# Patient Record
Sex: Female | Born: 1995 | Race: Black or African American | Hispanic: No | Marital: Single | Smoking: Never smoker
Health system: Southern US, Community
[De-identification: ages and names within clinical notes are randomized; demographics above are authoritative.]

## PROBLEM LIST (undated history)

## (undated) ENCOUNTER — Inpatient Hospital Stay: Payer: Self-pay

## (undated) DIAGNOSIS — O24419 Gestational diabetes mellitus in pregnancy, unspecified control: Secondary | ICD-10-CM

## (undated) DIAGNOSIS — T7840XA Allergy, unspecified, initial encounter: Secondary | ICD-10-CM

## (undated) HISTORY — DX: Allergy, unspecified, initial encounter: T78.40XA

## (undated) HISTORY — PX: MOUTH SURGERY: SHX715

## (undated) HISTORY — DX: Gestational diabetes mellitus in pregnancy, unspecified control: O24.419

---
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
## 2017-08-22 NOTE — L&D Delivery Note (Signed)
Delivery Note At 12:32 AM a viable female infant was delivered via Vaginal, Spontaneous (Presentation: OA).  APGAR: 8, 9; weight  pending.   Placenta status: delivered spontaneously, intact.  Cord:  3VC with the following complications: none.  Cord pH: n/a  Anesthesia:  epidural Episiotomy: None Lacerations: 2nd degree;Vaginal Suture Repair: 3.0 vicryl Est. Blood Loss (mL): 350  Mom to postpartum.  Baby to Couplet care / Skin to Skin.  Called to see patient.  Mom pushed to deliver a viable female infant.  The head followed by shoulders, which delivered without difficulty, and the rest of the body.  No nuchal cord noted.  Baby to mom's chest.  Cord clamped and cut after > 1 min delay.  Cord blood obtained.  Placenta delivered spontaneously, intact, with a 3-vessel cord.  Second degree vaginal laceration repaired with 3-0 Vicryl in standard fashion.  All counts correct.  Hemostasis obtained with IV pitocin and fundal massage. EBL 350 mL.     Thomasene MohairStephen Tristen Luce, MD 08/19/2018, 12:58 AM

## 2018-01-18 ENCOUNTER — Ambulatory Visit (INDEPENDENT_AMBULATORY_CARE_PROVIDER_SITE_OTHER): Payer: Medicaid Other | Admitting: Advanced Practice Midwife

## 2018-01-18 ENCOUNTER — Encounter: Payer: Self-pay | Admitting: Advanced Practice Midwife

## 2018-01-18 VITALS — BP 118/66 | Wt 135.0 lb

## 2018-01-18 DIAGNOSIS — Z1379 Encounter for other screening for genetic and chromosomal anomalies: Secondary | ICD-10-CM

## 2018-01-18 DIAGNOSIS — Z113 Encounter for screening for infections with a predominantly sexual mode of transmission: Secondary | ICD-10-CM

## 2018-01-18 DIAGNOSIS — Z3400 Encounter for supervision of normal first pregnancy, unspecified trimester: Secondary | ICD-10-CM | POA: Insufficient documentation

## 2018-01-18 NOTE — Progress Notes (Signed)
New Obstetric Patient H&P    Chief Complaint: "Desires prenatal care"   History of Present Illness: Patient is a 22 y.o. G1P0000 Not Hispanic or Latino female, presents with amenorrhea and positive home pregnancy test. Patient's last menstrual period was 11/24/2017 (exact date). and based on her  LMP, her EDD is Estimated Date of Delivery: 08/31/18 and her EGA is [redacted]w[redacted]d. Cycles are 5. days, regular, and occur approximately every : 28 days. Her last pap smear was 1 years ago and was no abnormalities.    She had a urine pregnancy test which was positive 1 week(s)  ago. Her last menstrual period was normal and lasted for  5 day(s). Since her LMP she claims she has experienced breast tenderness, fatigue, nausea, vomiting. She denies vaginal bleeding. Her past medical history is noncontributory. This is her first pregnancy.  Since her LMP, she admits to the use of tobacco products  no She claims she has gained   5 pounds since the start of her pregnancy.  There are cats in the home in the home  no  She admits close contact with children on a regular basis  yes  She has had chicken pox in the past yes She has had Tuberculosis exposures, symptoms, or previously tested positive for TB   no Current or past history of domestic violence. no  Genetic Screening/Teratology Counseling: (Includes patient, baby's father, or anyone in either family with:)   1. Patient's age >/= 57 [facility]  no 2. Thalassemia ([place], [place], Mediterranean, or Asian background): MCV<80  no 3. Neural tube defect (meningomyelocele, spina bifida, anencephaly)  no 4. Congenital heart defect  no  5. Down syndrome  no 6. Tay-Sachs (Jewish, [place]  no 7. Canavan's Disease  no 8. Sickle cell disease or trait (African)  no  9. Hemophilia or other blood disorders  no  10. Muscular dystrophy  no  11. Cystic fibrosis  no  12. Huntington's Chorea  no  13. Mental retardation/autism  no 14. Other inherited genetic or  chromosomal disorder  no 15. Maternal metabolic disorder (DM, PKU, etc)  no 16. Patient or FOB with a child with a birth defect not listed above no  16a. Patient or FOB with a birth defect themselves no 17. Recurrent pregnancy loss, or stillbirth  no  18. Any medications since LMP other than prenatal vitamins (include vitamins, supplements, OTC meds, drugs, alcohol)  no 19. Any other genetic/environmental exposure to discuss  no  Infection History:   1. Lives with someone with TB or TB exposed  no  2. Patient or partner has history of genital herpes  no 3. Rash or viral illness since LMP  no 4. History of STI (GC, CT, HPV, syphilis, HIV)  no 5. History of recent travel :  no  Other pertinent information:  no     Review of Systems:10 point review of systems negative unless otherwise noted in HPI  Past Medical History:  History reviewed. No pertinent past medical history.  Past Surgical History:  History reviewed. No pertinent surgical history.  Gynecologic History: Patient's last menstrual period was 11/24/2017 (exact date).  Obstetric History: G1P0000  Family History:  History reviewed. No pertinent family history.  Social History:  Social History   Socioeconomic History  . Marital status: Single    Spouse name: Not on file  . Number of children: Not on file  . Years of education: Not on file  . Highest education level: Not on file  Occupational History  . Not on file  Social Needs  . Financial resource strain: Not on file  . Food insecurity:    Worry: Not on file    Inability: Not on file  . Transportation needs:    Medical: Not on file    Non-medical: Not on file  Tobacco Use  . Smoking status: Never Smoker  . Smokeless tobacco: Never Used  Substance and Sexual Activity  . Alcohol use: Never    Frequency: Never  . Drug use: Never  . Sexual activity: Yes    Birth control/protection: None  Lifestyle  . Physical activity:    Days per week: Not on file      Minutes per session: Not on file  . Stress: Not on file  Relationships  . Social connections:    Talks on phone: Not on file    Gets together: Not on file    Attends religious service: Not on file    Active member of club or organization: Not on file    Attends meetings of clubs or organizations: Not on file    Relationship status: Not on file  . Intimate partner violence:    Fear of current or ex partner: Not on file    Emotionally abused: Not on file    Physically abused: Not on file    Forced sexual activity: Not on file  Other Topics Concern  . Not on file  Social History Narrative  . Not on file    Allergies:  No Known Allergies  Medications: Prior to Admission medications   Not on File    Physical Exam Vitals: Blood pressure 118/66, weight 135 lb (61.2 kg), last menstrual period 11/24/2017.  General: NAD HEENT: normocephalic, anicteric Thyroid: no enlargement, no palpable nodules Pulmonary: No increased work of breathing, CTAB Cardiovascular: RRR, distal pulses 2+ Abdomen: NABS, soft, non-tender, non-distended.  Umbilicus without lesions.  No hepatomegaly, splenomegaly or masses palpable. No evidence of hernia  Genitourinary:  External: Normal external female genitalia.  Normal urethral meatus, normal  Bartholin's and Skene's glands.    Vagina: Normal vaginal mucosa, no evidence of prolapse.    Cervix: Grossly normal in appearance, no bleeding, no CMT  Uterus: Enlarged, mobile, normal contour.   Adnexa: ovaries non-enlarged, no adnexal masses  Rectal: deferred Extremities: no edema, erythema, or tenderness Neurologic: Grossly intact Psychiatric: mood appropriate, affect full   Assessment: 22 y.o. G1P0000 at [redacted]w[redacted]d presenting to initiate prenatal care  Plan: 1) Avoid alcoholic beverages. 2) Patient encouraged not to smoke.  3) Discontinue the use of all non-medicinal drugs and chemicals.  4) Take prenatal vitamins daily.  5) Nutrition, food safety  (fish, cheese advisories, and high nitrite foods) and exercise discussed. 6) Hospital and practice style discussed with cross coverage system.  7) Genetic Screening, such as with 1st Trimester Screening, cell free fetal DNA, AFP testing, and Ultrasound, as well as with amniocentesis and CVS as appropriate, is discussed with patient. At the conclusion of today's visit patient requested cell free DNA genetic testing 8) Patient is asked about travel to areas at risk for the Zika virus, and counseled to avoid travel and exposure to mosquitoes or sexual partners who may have themselves been exposed to the virus. Testing is discussed, and will be ordered as appropriate.  9) Dating scan in next week   Tresea Mall, CNM [facility], [facility] 01/18/2018, 2:23 PM

## 2018-01-18 NOTE — Patient Instructions (Signed)
Exercise During Pregnancy For people of all ages, exercise is an important part of being healthy. Exercise improves heart and lung function and helps to maintain strength, flexibility, and a healthy body weight. Exercise also boosts energy levels and elevates mood. For most women, maintaining an exercise routine throughout pregnancy is recommended. It is only on rare occasions and with certain medical conditions or pregnancy complications that women may be asked to limit or avoid exercise during pregnancy. What are some other benefits to exercising during pregnancy? Along with maintaining strength and flexibility, exercising throughout pregnancy can help to:  Keep strength in muscles that are very important during labor and childbirth.  Decrease low back pain during pregnancy.  Decrease the risk of developing gestational diabetes mellitus (GDM).  Improve blood sugar (glucose) control for women who have GDM.  Decrease the risk of developing preeclampsia. This is a serious condition that causes high blood pressure along with other symptoms, such as swelling and headaches.  Decrease the risk of cesarean delivery.  Speed up the recovery after giving birth.  How often should I exercise? Unless your health care provider gives you different instructions, you should try to exercise on most days or all days of the week. In general, try to exercise with moderate intensity for about 150 minutes per week. This can be spread out across several days, such as exercising for 30 minutes per day on 5 days of each week. You can tell that you are exercising at a moderate intensity if you have a higher heart rate and faster breathing, but you are still able to hold a conversation. What types of moderate-intensity exercise are recommended during pregnancy? There are many types of exercise that are safe for you to do during pregnancy. Unless your health care provider gives you different instructions, do a variety of  exercises that safely increase your heart and breathing (cardiopulmonary) rates and help you to build and maintain muscle strength (strength training). You should always be able to talk in full sentences while exercising during pregnancy. Some examples of exercising that is safe to do during pregnancy include:  Brisk walking or hiking.  Swimming.  Water aerobics.  Riding a stationary bike.  Strength training.  Modified yoga or Pilates. Tell your instructor that you are pregnant. Avoid overstretching and avoid lying on your back for long periods of time.  Running or jogging. Only choose this type of exercise if: ? You ran or jogged regularly before your pregnancy. ? You can run or jog and still talk in complete sentences.  What types of exercise should I not do during pregnancy? Depending on your level of fitness and whether you exercised regularly before your pregnancy, you may be advised to limit vigorous-intensity exercise during your pregnancy. You can tell that you are exercising at a vigorous intensity if you are breathing much harder and faster and cannot hold a conversation while exercising. Some examples of exercising that you should avoid during pregnancy include:  Contact sports.  Activities that place you at risk for falling on or being hit in the belly, such as downhill skiing, water skiing, surfing, rock climbing, cycling, gymnastics, and horseback riding.  Scuba diving.  Sky diving.  Yoga or Pilates in a room that is heated to extreme temperatures ("hot yoga" or "hot Pilates").  Jogging or running, unless you ran or jogged regularly before your pregnancy. While jogging or running, you should always be able to talk in full sentences. Do not run or jog so vigorously   that you are unable to have a conversation.  If you are not used to exercising at elevation (more than 6,000 feet above sea level), do not do so during your pregnancy.  When should I avoid exercising  during pregnancy? Certain medical conditions can make it unsafe to exercise during pregnancy, or they may increase your risk of miscarriage or early labor and birth. Some of these conditions include:  Some types of heart disease.  Some types of lung disease.  Placenta previa. This is when the placenta partially or completely covers the opening of the uterus (cervix).  Frequent bleeding from the vagina during your pregnancy.  Incompetent cervix. This is when your cervix does not remain as tightly closed during pregnancy as it should.  Premature labor.  Ruptured membranes. This is when the protective sac (amniotic sac) opens up and amniotic fluid leaks from your vagina.  Severely low blood count (anemia).  Preeclampsia or pregnancy-caused high blood pressure.  Carrying more than one baby (multiple gestation) and having an additional risk of early labor.  Poorly controlled diabetes.  Being severely underweight or severely overweight.  Intrauterine growth restriction. This is when your baby's growth and development during pregnancy are slower than expected.  Other medical conditions. Ask your health care provider if any apply to you.  What else should I know about exercising during pregnancy? You should take these precautions while exercising during pregnancy:  Avoid overheating. ? Wear loose-fitting, breathable clothes. ? Do not exercise in very high temperatures.  Avoid dehydration. Drink enough water before, during, and after exercise to keep your urine clear or pale yellow.  Avoid overstretching. Because of hormone changes during pregnancy, it is easy to overstretch muscles, tendons, and ligaments during pregnancy.  Start slowly and ask your health care provider to recommend types of exercise that are safe for you, if exercising regularly is new for you.  Pregnancy is not a time for exercising to lose weight. When should I seek medical care? You should stop exercising  and call your health care provider if you have any unusual symptoms, such as:  Mild uterine contractions or abdominal cramping.  Dizziness that does not improve with rest.  When should I seek immediate medical care? You should stop exercising and call your local emergency services (911 in the [place] if you have any unusual symptoms, such as:  Sudden, severe pain in your low back or your belly.  Uterine contractions or abdominal cramping that do not improve with rest.  Chest pain.  Bleeding or fluid leaking from your vagina.  Shortness of breath.  This information is not intended to replace advice given to you by your health care provider. Make sure you discuss any questions you have with your health care provider. Document Released: 08/08/2005 Document Revised: 01/06/2016 Document Reviewed: 10/16/2014 Elsevier Interactive Patient Education  2018 [employer] Eating Plan for Pregnant Women While you are pregnant, your body will require additional nutrition to help support your growing baby. It is recommended that you consume:  150 additional calories each day during your first trimester.  300 additional calories each day during your second trimester.  300 additional calories each day during your third trimester.  Eating a healthy, well-balanced diet is very important for your health and for your baby's health. You also have a higher need for some vitamins and minerals, such as folic acid, calcium, iron, and vitamin D. What do I need to know about eating during pregnancy?  Do not try to lose weight   or go on a diet during pregnancy.  Choose healthy, nutritious foods. Choose  of a sandwich with a glass of milk instead of a candy bar or a high-calorie sugar-sweetened beverage.  Limit your overall intake of foods that have "empty calories." These are foods that have little nutritional value, such as sweets, desserts, candies, sugar-sweetened beverages, and fried foods.  Eat a  variety of foods, especially fruits and vegetables.  Take a prenatal vitamin to help meet the additional needs during pregnancy, specifically for folic acid, iron, calcium, and vitamin D.  Remember to stay active. Ask your health care provider for exercise recommendations that are specific to you.  Practice good food safety and cleanliness, such as washing your hands before you eat and after you prepare raw meat. This helps to prevent foodborne illnesses, such as listeriosis, that can be very dangerous for your baby. Ask your health care provider for more information about listeriosis. What does 150 extra calories look like? Healthy options for an additional 150 calories each day could be any of the following:  Plain low-fat yogurt (6-8 oz) with  cup of berries.  1 apple with 2 teaspoons of peanut butter.  Cut-up vegetables with  cup of hummus.  Low-fat chocolate milk (8 oz or 1 cup).  1 string cheese with 1 medium orange.   of a peanut butter and jelly sandwich on whole-wheat bread (1 tsp of peanut butter).  For 300 calories, you could eat two of those healthy options each day. What is a healthy amount of weight to gain? The recommended amount of weight for you to gain is based on your pre-pregnancy BMI. If your pre-pregnancy BMI was:  Less than 18 (underweight), you should gain 28-40 lb.  18-24.9 (normal), you should gain 25-35 lb.  25-29.9 (overweight), you should gain 15-25 lb.  Greater than 30 (obese), you should gain 11-20 lb.  What if I am having twins or multiples? Generally, pregnant women who will be having twins or multiples may need to increase their daily calories by 300-600 calories each day. The recommended range for total weight gain is 25-54 lb, depending on your pre-pregnancy BMI. Talk with your health care provider for specific guidance about additional nutritional needs, weight gain, and exercise during your pregnancy. What foods can I eat? Grains Any  grains. Try to choose whole grains, such as whole-wheat bread, oatmeal, or brown rice. Vegetables Any vegetables. Try to eat a variety of colors and types of vegetables to get a full range of vitamins and minerals. Remember to wash your vegetables well before eating. Fruits Any fruits. Try to eat a variety of colors and types of fruit to get a full range of vitamins and minerals. Remember to wash your fruits well before eating. Meats and Other Protein Sources Lean meats, including chicken, turkey, fish, and lean cuts of beef, veal, or pork. Make sure that all meats are cooked to "well done." Tofu. Tempeh. Beans. Eggs. Peanut butter and other nut butters. Seafood, such as shrimp, crab, and lobster. If you choose fish, select types that are higher in omega-3 fatty acids, including salmon, herring, mussels, trout, sardines, and pollock. Make sure that all meats are cooked to food-safe temperatures. Dairy Pasteurized milk and milk alternatives. Pasteurized yogurt and pasteurized cheese. Cottage cheese. Sour cream. Beverages Water. Juices that contain 100% fruit juice or vegetable juice. Caffeine-free teas and decaffeinated coffee. Drinks that contain caffeine are okay to drink, but it is better to avoid caffeine. Keep your total caffeine   intake to less than 200 mg each day (12 oz of coffee, tea, or soda) or as directed by your health care provider. Condiments Any pasteurized condiments. Sweets and Desserts Any sweets and desserts. Fats and Oils Any fats and oils. The items listed above may not be a complete list of recommended foods or beverages. Contact your dietitian for more options. What foods are not recommended? Vegetables Unpasteurized (raw) vegetable juices. Fruits Unpasteurized (raw) fruit juices. Meats and Other Protein Sources Cured meats that have nitrates, such as bacon, salami, and hotdogs. Luncheon meats, bologna, or other deli meats (unless they are reheated until they are  steaming hot). Refrigerated pate, meat spreads from a meat counter, smoked seafood that is found in the refrigerated section of a store. Raw fish, such as sushi or sashimi. High mercury content fish, such as tilefish, shark, swordfish, and king mackerel. Raw meats, such as tuna or beef tartare. Undercooked meats and poultry. Make sure that all meats are cooked to food-safe temperatures. Dairy Unpasteurized (raw) milk and any foods that have raw milk in them. Soft cheeses, such as feta, queso blanco, queso fresco, Brie, Camembert cheeses, blue-veined cheeses, and Panela cheese (unless it is made with pasteurized milk, which must be stated on the label). Beverages Alcohol. Sugar-sweetened beverages, such as sodas, teas, or energy drinks. Condiments Homemade fermented foods and drinks, such as pickles, sauerkraut, or kombucha drinks. (Store-bought pasteurized versions of these are okay.) Other Salads that are made in the store, such as ham salad, chicken salad, egg salad, tuna salad, and seafood salad. The items listed above may not be a complete list of foods and beverages to avoid. Contact your dietitian for more information. This information is not intended to replace advice given to you by your health care provider. Make sure you discuss any questions you have with your health care provider. Document Released: 05/23/2014 Document Revised: 01/14/2016 Document Reviewed: 01/21/2014 Elsevier Interactive Patient Education  2018 [employer] Prenatal Care WHAT IS PRENATAL CARE? Prenatal care is the process of caring for a pregnant woman before she gives birth. Prenatal care makes sure that she and her baby remain as healthy as possible throughout pregnancy. Prenatal care may be provided by a midwife, family practice health care provider, or a childbirth and pregnancy specialist (obstetrician). Prenatal care may include physical examinations, testing, treatments, and education on nutrition, lifestyle, and  social support services. WHY IS PRENATAL CARE SO IMPORTANT? Early and consistent prenatal care increases the chance that you and your baby will remain healthy throughout your pregnancy. This type of care also decreases a baby's risk of being born too early (prematurely), or being born smaller than expected (small for gestational age). Any underlying medical conditions you may have that could pose a risk during your pregnancy are discussed during prenatal care visits. You will also be monitored regularly for any new conditions that may arise during your pregnancy so they can be treated quickly and effectively. WHAT HAPPENS DURING PRENATAL CARE VISITS? Prenatal care visits may include the following: Discussion Tell your health care provider about any new signs or symptoms you have experienced since your last visit. These might include:  Nausea or vomiting.  Increased or decreased level of energy.  Difficulty sleeping.  Back or leg pain.  Weight changes.  Frequent urination.  Shortness of breath with physical activity.  Changes in your skin, such as the development of a rash or itchiness.  Vaginal discharge or bleeding.  Feelings of excitement or nervousness.  Changes in   your baby's movements.  You may want to write down any questions or topics you want to discuss with your health care provider and bring them with you to your appointment. Examination During your first prenatal care visit, you will likely have a complete physical exam. Your health care provider will often examine your vagina, cervix, and the position of your uterus, as well as check your heart, lungs, and other body systems. As your pregnancy progresses, your health care provider will measure the size of your uterus and your baby's position inside your uterus. He or she may also examine you for early signs of labor. Your prenatal visits may also include checking your blood pressure and, after about 10-12 weeks of  pregnancy, listening to your baby's heartbeat. Testing Regular testing often includes:  Urinalysis. This checks your urine for glucose, protein, or signs of infection.  Blood count. This checks the levels of white and red blood cells in your body.  Tests for sexually transmitted infections (STIs). Testing for STIs at the beginning of pregnancy is routinely done and is required in many states.  Antibody testing. You will be checked to see if you are immune to certain illnesses, such as rubella, that can affect a developing fetus.  Glucose screen. Around 24-28 weeks of pregnancy, your blood glucose level will be checked for signs of gestational diabetes. Follow-up tests may be recommended.  Group B strep. This is a bacteria that is commonly found inside a woman's vagina. This test will inform your health care provider if you need an antibiotic to reduce the amount of this bacteria in your body prior to labor and childbirth.  Ultrasound. Many pregnant women undergo an ultrasound screening around 18-20 weeks of pregnancy to evaluate the health of the fetus and check for any developmental abnormalities.  HIV (human immunodeficiency virus) testing. Early in your pregnancy, you will be screened for HIV. If you are at high risk for HIV, this test may be repeated during your third trimester of pregnancy.  You may be offered other testing based on your age, personal or family medical history, or other factors. HOW OFTEN SHOULD I PLAN TO SEE MY HEALTH CARE PROVIDER FOR PRENATAL CARE? Your prenatal care check-up schedule depends on any medical conditions you have before, or develop during, your pregnancy. If you do not have any underlying medical conditions, you will likely be seen for checkups:  Monthly, during the first 6 months of pregnancy.  Twice a month during months 7 and 8 of pregnancy.  Weekly starting in the 9th month of pregnancy and until delivery.  If you develop signs of early labor  or other concerning signs or symptoms, you may need to see your health care provider more often. Ask your health care provider what prenatal care schedule is best for you. WHAT CAN I DO TO KEEP MYSELF AND MY BABY AS HEALTHY AS POSSIBLE DURING MY PREGNANCY?  Take a prenatal vitamin containing 400 micrograms (0.4 mg) of folic acid every day. Your health care provider may also ask you to take additional vitamins such as iodine, vitamin D, iron, copper, and zinc.  Take 1500-2000 mg of calcium daily starting at your 20th week of pregnancy until you deliver your baby.  Make sure you are up to date on your vaccinations. Unless directed otherwise by your health care provider: ? You should receive a tetanus, diphtheria, and pertussis (Tdap) vaccination between the 27th and 36th week of your pregnancy, regardless of when your last Tdap immunization   occurred. This helps protect your baby from whooping cough (pertussis) after he or she is born. ? You should receive an annual inactivated influenza vaccine (IIV) to help protect you and your baby from influenza. This can be done at any point during your pregnancy.  Eat a well-rounded diet that includes: ? Fresh fruits and vegetables. ? Lean proteins. ? Calcium-rich foods such as milk, yogurt, hard cheeses, and dark, leafy greens. ? Whole grain breads.  Do noteat seafood high in mercury, including: ? Swordfish. ? Tilefish. ? Shark. ? King mackerel. ? More than 6 oz tuna per week.  Do not eat: ? Raw or undercooked meats or eggs. ? Unpasteurized foods, such as soft cheeses (brie, blue, or feta), juices, and milks. ? Lunch meats. ? Hot dogs that have not been heated until they are steaming.  Drink enough water to keep your urine clear or pale yellow. For many women, this may be 10 or more 8 oz glasses of water each day. Keeping yourself hydrated helps deliver nutrients to your baby and may prevent the start of pre-term uterine contractions.  Do not use  any tobacco products including cigarettes, chewing tobacco, or electronic cigarettes. If you need help quitting, ask your health care provider.  Do not drink beverages containing alcohol. No safe level of alcohol consumption during pregnancy has been determined.  Do not use any illegal drugs. These can harm your developing baby or cause a miscarriage.  Ask your health care provider or pharmacist before taking any prescription or over-the-counter medicines, herbs, or supplements.  Limit your caffeine intake to no more than 200 mg per day.  Exercise. Unless told otherwise by your health care provider, try to get 30 minutes of moderate exercise most days of the week. Do not  do high-impact activities, contact sports, or activities with a high risk of falling, such as horseback riding or downhill skiing.  Get plenty of rest.  Avoid anything that raises your body temperature, such as hot tubs and saunas.  If you own a cat, do not empty its litter box. Bacteria contained in cat feces can cause an infection called toxoplasmosis. This can result in serious harm to the fetus.  Stay away from chemicals such as insecticides, lead, mercury, and cleaning or paint products that contain solvents.  Do not have any X-rays taken unless medically necessary.  Take a childbirth and breastfeeding preparation class. Ask your health care provider if you need a referral or recommendation.  This information is not intended to replace advice given to you by your health care provider. Make sure you discuss any questions you have with your health care provider. Document Released: 08/11/2003 Document Revised: 01/11/2016 Document Reviewed: 10/23/2013 Elsevier Interactive Patient Education  2017 [employer]  

## 2018-01-18 NOTE — Progress Notes (Signed)
NOB Confirmed [facility]D

## 2018-01-20 LAB — CHLAMYDIA/GONOCOCCUS/TRICHOMONAS, NAA
Chlamydia by NAA: NEGATIVE
Gonococcus by NAA: NEGATIVE
TRICH VAG BY NAA: NEGATIVE

## 2018-01-20 LAB — URINE CULTURE: Organism ID, Bacteria: NO GROWTH

## 2018-01-23 ENCOUNTER — Ambulatory Visit (INDEPENDENT_AMBULATORY_CARE_PROVIDER_SITE_OTHER): Payer: Medicaid Other | Admitting: Maternal Newborn

## 2018-01-23 ENCOUNTER — Ambulatory Visit (INDEPENDENT_AMBULATORY_CARE_PROVIDER_SITE_OTHER): Payer: Medicaid Other

## 2018-01-23 ENCOUNTER — Encounter: Payer: Self-pay | Admitting: Maternal Newborn

## 2018-01-23 VITALS — BP 120/70 | Wt 133.0 lb

## 2018-01-23 DIAGNOSIS — Z3A08 8 weeks gestation of pregnancy: Secondary | ICD-10-CM

## 2018-01-23 DIAGNOSIS — Z3400 Encounter for supervision of normal first pregnancy, unspecified trimester: Secondary | ICD-10-CM

## 2018-01-23 DIAGNOSIS — Z3401 Encounter for supervision of normal first pregnancy, first trimester: Secondary | ICD-10-CM

## 2018-01-23 NOTE — Progress Notes (Signed)
    Routine Prenatal Care Visit  Subjective  [name] [name] is a 22 y.o. G1P0000 at 9532w4d being seen today for ongoing prenatal care.  She is currently monitored for the following issues for this low-risk [name] and has Supervision of normal first [name], [name] on their problem list.  ----------------------------------------------------------------------------------- Patient reports no complaints.   Vag. Bleeding: None. ----------------------------------------------------------------------------------- The following portions of the patient's history were reviewed and updated as appropriate: allergies, current medications, past family history, past medical history, past social history, past surgical history and problem list. Problem list updated.   Objective  Blood pressure 120/70, weight 133 lb (60.3 kg), last menstrual period 11/24/2017. Pregravid weight 135 lb (61.2 kg) Total Weight Gain -2 lb (-0.907 kg)  Fetal Status: Fetal Heart Rate (bpm): 171         General:  Alert, oriented and cooperative. Patient is in no acute distress.  Skin: Skin is warm and dry. No rash noted.   Cardiovascular: Normal heart rate noted  Respiratory: Normal respiratory effort, no problems with respiration noted  Abdomen: Soft, gravid, appropriate for gestational age.       Pelvic:  Cervical exam deferred        Extremities: Normal range of motion.     Mental Status: Normal mood and affect. Normal behavior. Normal judgment and thought content.     Assessment   22 y.o. G1P0000 at 2932w4d, EDD 08/31/2018 by Last Menstrual Period presenting for routine prenatal visit.  Plan   [name]#1 Problems (from 11/24/17 to present)    Problem Noted Resolved   Supervision of normal first [name], [name] 01/18/2018 by Tresea MallGledhill, Jane, CNM No   Overview Signed 01/18/2018  2:19 PM by Tresea MallGledhill, Jane, CNM    [facility] Prenatal Labs  Dating  Blood type:     Genetic Screen 1 Screen:    AFP:     Quad:      NIPS: Antibody:   Anatomic US  Rubella:   Varicella: @VZVIGG @  GTT Early:               Third trimester:  RPR:     Rhogam  HBsAg:     TDaP vaccine                       Flu Shot: HIV:     Baby Food Breast                               GBS:   Contraception  Pap:  CBB     CS/VBAC NA   Support Person Boyfriend Antonio            Ultrasound today shows single IUP, size=dates, FHR 171 bpm Ovarian cyst on right side, 3.03 x 2.47 cm.  Desires genetic screening, will return for cell free fetal DNA after 10 weeks.  Please refer to After Visit Summary for other counseling recommendations.   Return in about 2 weeks (around 02/06/2018) for ROB and MaterniTi 21.  Marcelyn BruinsJacelyn Schmid, CNM 01/23/2018  11:28 AM

## 2018-01-23 NOTE — Patient Instructions (Signed)
First Trimester of Pregnancy The first trimester of pregnancy is from week 1 until the end of week 13 (months 1 through 3). A week after a sperm fertilizes an egg, the egg will implant on the wall of the uterus. This embryo will begin to develop into a baby. Genes from you and your partner will form the baby. The female genes will determine whether the baby will be a boy or a girl. At 6-8 weeks, the eyes and face will be formed, and the heartbeat can be seen on ultrasound. At the end of 12 weeks, all the baby's organs will be formed. Now that you are pregnant, you will want to do everything you can to have a healthy baby. Two of the most important things are to get good prenatal care and to follow your health care provider's instructions. Prenatal care is all the medical care you receive before the baby's birth. This care will help prevent, find, and treat any problems during the pregnancy and childbirth. Body changes during your first trimester Your body goes through many changes during pregnancy. The changes vary from woman to woman.  You may gain or lose a couple of pounds at first.  You may feel sick to your stomach (nauseous) and you may throw up (vomit). If the vomiting is uncontrollable, call your health care provider.  You may tire easily.  You may develop headaches that can be relieved by medicines. All medicines should be approved by your health care provider.  You may urinate more often. Painful urination may mean you have a bladder infection.  You may develop heartburn as a result of your pregnancy.  You may develop constipation because certain hormones are causing the muscles that push stool through your intestines to slow down.  You may develop hemorrhoids or swollen veins (varicose veins).  Your breasts may begin to grow larger and become tender. Your nipples may stick out more, and the tissue that surrounds them (areola) may become darker.  Your gums may bleed and may be  sensitive to brushing and flossing.  Dark spots or blotches (chloasma, mask of pregnancy) may develop on your face. This will likely fade after the baby is born.  Your menstrual periods will stop.  You may have a loss of appetite.  You may develop cravings for certain kinds of food.  You may have changes in your emotions from day to day, such as being excited to be pregnant or being concerned that something may go wrong with the pregnancy and baby.  You may have more vivid and strange dreams.  You may have changes in your hair. These can include thickening of your hair, rapid growth, and changes in texture. Some women also have hair loss during or after pregnancy, or hair that feels dry or thin. Your hair will most likely return to normal after your baby is born.  What to expect at prenatal visits During a routine prenatal visit:  You will be weighed to make sure you and the baby are growing normally.  Your blood pressure will be taken.  Your abdomen will be measured to track your baby's growth.  The fetal heartbeat will be listened to between weeks 10 and 14 of your pregnancy.  Test results from any previous visits will be discussed.  Your health care provider may ask you:  How you are feeling.  If you are feeling the baby move.  If you have had any abnormal symptoms, such as leaking fluid, bleeding, severe headaches,   or abdominal cramping.  If you are using any tobacco products, including cigarettes, chewing tobacco, and electronic cigarettes.  If you have any questions.  Other tests that may be performed during your first trimester include:  Blood tests to find your blood type and to check for the presence of any previous infections. The tests will also be used to check for low iron levels (anemia) and protein on red blood cells (Rh antibodies). Depending on your risk factors, or if you previously had diabetes during pregnancy, you may have tests to check for high blood  sugar that affects pregnant women (gestational diabetes).  Urine tests to check for infections, diabetes, or protein in the urine.  An ultrasound to confirm the proper growth and development of the baby.  Fetal screens for spinal cord problems (spina bifida) and Down syndrome.  HIV (human immunodeficiency virus) testing. Routine prenatal testing includes screening for HIV, unless you choose not to have this test.  You may need other tests to make sure you and the baby are doing well.  Follow these instructions at home: Medicines  Follow your health care provider's instructions regarding medicine use. Specific medicines may be either safe or unsafe to take during pregnancy.  Take a prenatal vitamin that contains at least 600 micrograms (mcg) of folic acid.  If you develop constipation, try taking a stool softener if your health care provider approves. Eating and drinking  Eat a balanced diet that includes fresh fruits and vegetables, whole grains, good sources of protein such as meat, eggs, or tofu, and low-fat dairy. Your health care provider will help you determine the amount of weight gain that is right for you.  Avoid raw meat and uncooked cheese. These carry germs that can cause birth defects in the baby.  Eating four or five small meals rather than three large meals a day may help relieve nausea and vomiting. If you start to feel nauseous, eating a few soda crackers can be helpful. Drinking liquids between meals, instead of during meals, also seems to help ease nausea and vomiting.  Limit foods that are high in fat and processed sugars, such as fried and sweet foods.  To prevent constipation: ? Eat foods that are high in fiber, such as fresh fruits and vegetables, whole grains, and beans. ? Drink enough fluid to keep your urine clear or pale yellow. Activity  Exercise only as directed by your health care provider. Most women can continue their usual exercise routine during  pregnancy. Try to exercise for 30 minutes at least 5 days a week. Exercising will help you: ? Control your weight. ? Stay in shape. ? Be prepared for labor and delivery.  Experiencing pain or cramping in the lower abdomen or lower back is a good sign that you should stop exercising. Check with your health care provider before continuing with normal exercises.  Try to avoid standing for long periods of time. Move your legs often if you must stand in one place for a long time.  Avoid heavy lifting.  Wear low-heeled shoes and practice good posture.  You may continue to have sex unless your health care provider tells you not to. Relieving pain and discomfort  Wear a good support bra to relieve breast tenderness.  Take warm sitz baths to soothe any pain or discomfort caused by hemorrhoids. Use hemorrhoid cream if your health care provider approves.  Rest with your legs elevated if you have leg cramps or low back pain.  If you develop   varicose veins in your legs, wear support hose. Elevate your feet for 15 minutes, 3-4 times a day. Limit salt in your diet. Prenatal care  Schedule your prenatal visits by the twelfth week of pregnancy. They are usually scheduled monthly at first, then more often in the last 2 months before delivery.  Write down your questions. Take them to your prenatal visits.  Keep all your prenatal visits as told by your health care provider. This is important. Safety  Wear your seat belt at all times when driving.  Make a list of emergency phone numbers, including numbers for family, friends, the hospital, and police and fire departments. General instructions  Ask your health care provider for a referral to a local prenatal education class. Begin classes no later than the beginning of month 6 of your pregnancy.  Ask for help if you have counseling or nutritional needs during pregnancy. Your health care provider can offer advice or refer you to specialists for help  with various needs.  Do not use hot tubs, steam rooms, or saunas.  Do not douche or use tampons or scented sanitary pads.  Do not cross your legs for long periods of time.  Avoid cat litter boxes and soil used by cats. These carry germs that can cause birth defects in the baby and possibly loss of the fetus by miscarriage or stillbirth.  Avoid all smoking, herbs, alcohol, and medicines not prescribed by your health care provider. Chemicals in these products affect the formation and growth of the baby.  Do not use any products that contain nicotine or tobacco, such as cigarettes and e-cigarettes. If you need help quitting, ask your health care provider. You may receive counseling support and other resources to help you quit.  Schedule a dentist appointment. At home, brush your teeth with a soft toothbrush and be gentle when you floss. Contact a health care provider if:  You have dizziness.  You have mild pelvic cramps, pelvic pressure, or nagging pain in the abdominal area.  You have persistent nausea, vomiting, or diarrhea.  You have a bad smelling vaginal discharge.  You have pain when you urinate.  You notice increased swelling in your face, hands, legs, or ankles.  You are exposed to fifth disease or chickenpox.  You are exposed to German measles (rubella) and have never had it. Get help right away if:  You have a fever.  You are leaking fluid from your vagina.  You have spotting or bleeding from your vagina.  You have severe abdominal cramping or pain.  You have rapid weight gain or loss.  You vomit blood or material that looks like coffee grounds.  You develop a severe headache.  You have shortness of breath.  You have any kind of trauma, such as from a fall or a car accident. Summary  The first trimester of pregnancy is from week 1 until the end of week 13 (months 1 through 3).  Your body goes through many changes during pregnancy. The changes vary from  woman to woman.  You will have routine prenatal visits. During those visits, your health care provider will examine you, discuss any test results you may have, and talk with you about how you are feeling. This information is not intended to replace advice given to you by your health care provider. Make sure you discuss any questions you have with your health care provider. Document Released: 08/02/2001 Document Revised: 07/20/2016 Document Reviewed: 07/20/2016 Elsevier Interactive Patient Education  2018 [employer]  

## 2018-01-23 NOTE — Progress Notes (Signed)
No concerns.rj 

## 2018-01-24 LAB — URINE DRUG PANEL 7
Amphetamines, Urine: NEGATIVE ng/mL
BENZODIAZEPINE QUANT UR: NEGATIVE ng/mL
Barbiturate Quant, Ur: NEGATIVE ng/mL
COCAINE (METAB.): NEGATIVE ng/mL
Cannabinoid Quant, Ur: POSITIVE — AB
OPIATE QUANT UR: NEGATIVE ng/mL
PCP Quant, Ur: NEGATIVE ng/mL

## 2018-02-06 ENCOUNTER — Ambulatory Visit (INDEPENDENT_AMBULATORY_CARE_PROVIDER_SITE_OTHER): Payer: Medicaid Other | Admitting: Obstetrics and Gynecology

## 2018-02-06 ENCOUNTER — Encounter: Payer: Self-pay | Admitting: Obstetrics and Gynecology

## 2018-02-06 VITALS — BP 112/72 | Ht 60.0 in | Wt 131.0 lb

## 2018-02-06 DIAGNOSIS — Z130 Encounter for screening for diseases of the blood and blood-forming organs and certain disorders involving the immune mechanism: Secondary | ICD-10-CM

## 2018-02-06 DIAGNOSIS — Z3400 Encounter for supervision of normal first pregnancy, unspecified trimester: Secondary | ICD-10-CM

## 2018-02-06 DIAGNOSIS — Z3A10 10 weeks gestation of pregnancy: Secondary | ICD-10-CM

## 2018-02-06 DIAGNOSIS — Z1379 Encounter for other screening for genetic and chromosomal anomalies: Secondary | ICD-10-CM

## 2018-02-06 DIAGNOSIS — O219 Vomiting of pregnancy, unspecified: Secondary | ICD-10-CM

## 2018-02-06 MED ORDER — DOXYLAMINE SUCCINATE (SLEEP) 25 MG PO TABS: 25.0000 mg | ORAL_TABLET | Freq: Four times a day (QID) | ORAL | 6 refills | Status: DC

## 2018-02-06 MED ORDER — PYRIDOXINE HCL 25 MG PO TABS: 25.0000 mg | ORAL_TABLET | Freq: Four times a day (QID) | ORAL | 6 refills | Status: DC | PRN

## 2018-02-06 NOTE — Progress Notes (Signed)
    Routine Prenatal Care Visit  Subjective  [name] [name] is a 22 y.o. G1P0000 at 2456w4d being seen today for ongoing prenatal care.  She is currently monitored for the following issues for this low-risk pregnancy and has Supervision of normal first pregnancy, antepartum on their problem list.  ----------------------------------------------------------------------------------- Patient reports no complaints.  Reports that she stopped smoking marijuana when she found out she was pregnant  . Vag. Bleeding: None.   . Denies leaking of fluid.  ----------------------------------------------------------------------------------- The following portions of the patient's history were reviewed and updated as appropriate: allergies, current medications, past family history, past medical history, past social history, past surgical history and problem list. Problem list updated.   Objective  Blood pressure 112/72, height 5' (1.524 m), weight 131 lb (59.4 kg), last menstrual period 11/24/2017. Pregravid weight 135 lb (61.2 kg) Total Weight Gain -4 lb (-1.814 kg) Urinalysis: Urine Protein: Trace Urine Glucose: Negative  Fetal Status: Fetal Heart Rate (bpm): 165         General:  Alert, oriented and cooperative. Patient is in no acute distress.  Skin: Skin is warm and dry. No rash noted.   Cardiovascular: Normal heart rate noted  Respiratory: Normal respiratory effort, no problems with respiration noted  Abdomen: Soft, gravid, appropriate for gestational age. Pain/Pressure: Absent     Pelvic:  Cervical exam deferred        Extremities: Normal range of motion.     ental Status: Normal mood and affect. Normal behavior. Normal judgment and thought content.     Assessment   22 y.o. G1P0000 at 4556w4d by  08/31/2018, by Last Menstrual Period presenting for routine prenatal visit  Plan   Pregnancy#1 Problems (from 11/24/17 to present)    Problem Noted Resolved   Supervision of normal first pregnancy,  antepartum 01/18/2018 by Tresea MallGledhill, Jane, CNM No   Overview Addendum 02/06/2018 10:53 AM by Natale MilchSchuman, Rosalind Guido R, MD    [facility] Prenatal Labs  Dating Lmp= 8 wk US Blood type:     Genetic Screen 1 Screen:    AFP:     Quad:     NIPS: Antibody:   Anatomic US  Rubella:   Varicella: @VZVIGG @  GTT Early:               Third trimester:  RPR:     Rhogam  HBsAg:     TDaP vaccine                       Flu Shot: HIV:     Baby Food Breast                               GBS:   Contraception  Pap:  CBB     CS/VBAC NA   Support Person Boyfriend Antonio               Gestational age appropriate obstetric precautions including but not limited to vaginal bleeding, contractions, leaking of fluid and fetal movement were reviewed in detail with the patient.    NOB and Materniti21 labs today Prescriptions for B6 and unisom sent Return in about 2 weeks (around 02/20/2018) for ROB.  Adelene Idlerhristanna Khloey Chern MD [facility], [facility] 02/06/18 10:59 AM

## 2018-02-06 NOTE — Progress Notes (Signed)
Pt reports no problems. Desires MaterniTi 21.

## 2018-02-08 LAB — HEMOGLOBINOPATHY EVALUATION
HEMOGLOBIN F QUANTITATION: 0 % (ref 0.0–2.0)
HGB A: 97.8 % (ref 96.4–98.8)
HGB C: 0 %
HGB S: 0 %
HGB VARIANT: 0 %
Hemoglobin A2 Quantitation: 2.2 % (ref 1.8–3.2)

## 2018-02-08 LAB — RPR+RH+ABO+RUB AB+AB SCR+CB...
Antibody Screen: NEGATIVE
HIV Screen 4th Generation wRfx: NONREACTIVE
Hematocrit: 40.7 % (ref 34.0–46.6)
Hemoglobin: 13.4 g/dL (ref 11.1–15.9)
Hepatitis B Surface Ag: NEGATIVE
MCH: 28.3 pg (ref 26.6–33.0)
MCHC: 32.9 g/dL (ref 31.5–35.7)
MCV: 86 fL (ref 79–97)
Platelets: 343 10*3/uL (ref 150–450)
RBC: 4.73 x10E6/uL (ref 3.77–5.28)
RDW: 13.5 % (ref 12.3–15.4)
RPR Ser Ql: NONREACTIVE
Rh Factor: POSITIVE
Rubella Antibodies, IGG: 5.14 index (ref >0.99)
Varicella zoster IgG: 1152 index (ref >165)
WBC: 11.2 10*3/uL — ABNORMAL HIGH (ref 3.4–10.8)

## 2018-02-10 LAB — MATERNIT 21 PLUS CORE, BLOOD
CHROMOSOME 13: NEGATIVE
CHROMOSOME 18: NEGATIVE
CHROMOSOME 21: NEGATIVE
Y Chromosome: NOT DETECTED

## 2018-02-12 ENCOUNTER — Telehealth: Payer: Self-pay | Admitting: Obstetrics and Gynecology

## 2018-02-12 NOTE — Telephone Encounter (Signed)
Patient is calling to follow upon her lab results. Please advise °

## 2018-02-12 NOTE — Progress Notes (Signed)
I called both number, home is disconnected, mobile does not have a voicemail set up. Please let patient know that the results of her prenatal labs were normal. Genetic screening normal. I believe she wanted an envelope with the gender. Thank you, Dr. Jerene PitchSchuman

## 2018-02-12 NOTE — Telephone Encounter (Signed)
Patient is calling for lab results please advise  ?

## 2018-02-20 ENCOUNTER — Encounter: Payer: Self-pay | Admitting: Advanced Practice Midwife

## 2018-02-20 ENCOUNTER — Ambulatory Visit (INDEPENDENT_AMBULATORY_CARE_PROVIDER_SITE_OTHER): Payer: Medicaid Other | Admitting: Advanced Practice Midwife

## 2018-02-20 VITALS — BP 124/74 | Wt 133.0 lb

## 2018-02-20 DIAGNOSIS — Z3A12 12 weeks gestation of pregnancy: Secondary | ICD-10-CM

## 2018-02-20 DIAGNOSIS — Z3401 Encounter for supervision of normal first pregnancy, first trimester: Secondary | ICD-10-CM

## 2018-02-20 NOTE — Progress Notes (Signed)
  Routine Prenatal Care Visit  Subjective  [name] [name] is a 22 y.o. G1P0000 at 7129w4d being seen today for ongoing prenatal care.  She is currently monitored for the following issues for this low-risk pregnancy and has Supervision of normal first pregnancy, antepartum on their problem list.  ----------------------------------------------------------------------------------- Patient reports no complaints.  Reviewed normal lab results with patient. She will pick up envelope with MaterniT 21 results. Nausea is improving- able to eat better.  . Vag. Bleeding: None.   . Denies leaking of fluid.  ----------------------------------------------------------------------------------- The following portions of the patient's history were reviewed and updated as appropriate: allergies, current medications, past family history, past medical history, past social history, past surgical history and problem list. Problem list updated.   Objective  Blood pressure 124/74, weight 133 lb (60.3 kg), last menstrual period 11/24/2017. Pregravid weight 135 lb (61.2 kg) Total Weight Gain -2 lb (-0.907 kg) Urinalysis:      Fetal Status: Fetal Heart Rate (bpm): 160         General:  Alert, oriented and cooperative. Patient is in no acute distress.  Skin: Skin is warm and dry. No rash noted.   Cardiovascular: Normal heart rate noted  Respiratory: Normal respiratory effort, no problems with respiration noted  Abdomen: Soft, gravid, appropriate for gestational age.       Pelvic:  Cervical exam deferred        Extremities: Normal range of motion.     Mental Status: Normal mood and affect. Normal behavior. Normal judgment and thought content.   Assessment   22 y.o. G1P0000 at 7429w4d by  08/31/2018, by Last Menstrual Period presenting for routine prenatal visit  Plan   Pregnancy#1 Problems (from 11/24/17 to present)    Problem Noted Resolved   Supervision of normal first pregnancy, antepartum 01/18/2018 by Tresea MallGledhill,  Aniruddh Ciavarella, CNM No   Overview Addendum 02/06/2018 10:53 AM by Natale MilchSchuman, Christanna R, MD    [facility] Prenatal Labs  Dating Lmp= 8 wk US Blood type:     Genetic Screen 1 Screen:    AFP:     Quad:     NIPS: Antibody:   Anatomic US  Rubella:   Varicella: @VZVIGG @  GTT Early:               Third trimester:  RPR:     Rhogam  HBsAg:     TDaP vaccine                       Flu Shot: HIV:     Baby Food Breast                               GBS:   Contraception  Pap:  CBB     CS/VBAC NA   Support Person Boyfriend Antonio               Preterm labor symptoms and general obstetric precautions including but not limited to vaginal bleeding, contractions, leaking of fluid and fetal movement were reviewed in detail with the patient. Please refer to After Visit Summary for other counseling recommendations.   Return in about 1 month (around 03/20/2018) for rob.  Tresea MallJane Jala Dundon, CNM 02/20/2018 8:51 AM

## 2018-02-20 NOTE — Patient Instructions (Signed)

## 2018-02-22 ENCOUNTER — Other Ambulatory Visit: Payer: Self-pay

## 2018-02-22 ENCOUNTER — Emergency Department
Admission: EM | Admit: 2018-02-22 | Discharge: 2018-02-23 | Disposition: A | Discharge status: Home / Self Care | Payer: Medicaid Other | Attending: Student in an Organized Health Care Education/Training Program | Admitting: Student in an Organized Health Care Education/Training Program

## 2018-02-22 DIAGNOSIS — R51 Headache: Secondary | ICD-10-CM | POA: Insufficient documentation

## 2018-02-22 DIAGNOSIS — O9989 Other specified diseases and conditions complicating pregnancy, childbirth and the puerperium: Secondary | ICD-10-CM | POA: Insufficient documentation

## 2018-02-22 DIAGNOSIS — R519 Headache, unspecified: Secondary | ICD-10-CM

## 2018-02-22 DIAGNOSIS — O210 Mild hyperemesis gravidarum: Secondary | ICD-10-CM | POA: Diagnosis not present

## 2018-02-22 DIAGNOSIS — Z3A13 13 weeks gestation of pregnancy: Secondary | ICD-10-CM | POA: Diagnosis not present

## 2018-02-22 LAB — COMPREHENSIVE METABOLIC PANEL
ALT: 26 U/L (ref 0–44)
AST: 28 U/L (ref 15–41)
Albumin: 3.8 g/dL (ref 3.5–5.0)
Alkaline Phosphatase: 64 U/L (ref 38–126)
Anion gap: 8 (ref 5–15)
BILIRUBIN TOTAL: 0.6 mg/dL (ref 0.3–1.2)
BUN: 8 mg/dL (ref 6–20)
CHLORIDE: 107 mmol/L (ref 98–111)
CO2: 24 mmol/L (ref 22–32)
CREATININE: 0.65 mg/dL (ref 0.44–1.00)
Calcium: 9.9 mg/dL (ref 8.9–10.3)
Glucose, Bld: 99 mg/dL (ref 70–99)
POTASSIUM: 3.3 mmol/L — AB (ref 3.5–5.1)
Sodium: 139 mmol/L (ref 135–145)
TOTAL PROTEIN: 7.3 g/dL (ref 6.5–8.1)

## 2018-02-22 LAB — CBC WITH DIFFERENTIAL/PLATELET
Basophils Absolute: 0.1 10*3/uL (ref 0–0.1)
Basophils Relative: 1 %
EOS ABS: 0.4 10*3/uL (ref 0–0.7)
Eosinophils Relative: 3 %
HEMATOCRIT: 38.8 % (ref 35.0–47.0)
Hemoglobin: 13 g/dL (ref 12.0–16.0)
LYMPHS ABS: 2.1 10*3/uL (ref 1.0–3.6)
Lymphocytes Relative: 15 %
MCH: 28.4 pg (ref 26.0–34.0)
MCHC: 33.5 g/dL (ref 32.0–36.0)
MCV: 84.9 fL (ref 80.0–100.0)
MONO ABS: 0.9 10*3/uL (ref 0.2–0.9)
MONOS PCT: 7 %
Neutro Abs: 10.1 10*3/uL — ABNORMAL HIGH (ref 1.4–6.5)
Neutrophils Relative %: 74 %
PLATELETS: 287 10*3/uL (ref 150–440)
RBC: 4.58 MIL/uL (ref 3.80–5.20)
RDW: 12.5 % (ref 11.5–14.5)
WBC: 13.7 10*3/uL — ABNORMAL HIGH (ref 3.6–11.0)

## 2018-02-22 LAB — LIPASE, BLOOD: Lipase: 35 U/L (ref 11–51)

## 2018-02-22 MED ORDER — ACETAMINOPHEN 500 MG PO TABS
ORAL_TABLET | ORAL | Status: AC
  Filled 2018-02-22: qty 2

## 2018-02-22 MED ORDER — DEXTROSE-NACL 5-0.45 % IV SOLN
INTRAVENOUS | Status: DC
  Administered 2018-02-22: 21:00:00 via INTRAVENOUS

## 2018-02-22 MED ORDER — DOXYLAMINE-PYRIDOXINE 10-10 MG PO TBEC: 1.0000 | DELAYED_RELEASE_TABLET | Freq: Two times a day (BID) | ORAL | 0 refills | Status: DC

## 2018-02-22 MED ORDER — ACETAMINOPHEN 500 MG PO TABS
1000.0000 mg | ORAL_TABLET | Freq: Once | ORAL | Status: AC
  Administered 2018-02-22: 1000 mg via ORAL

## 2018-02-22 MED ORDER — SODIUM CHLORIDE 0.9 % IV BOLUS: 1000.0000 mL | Freq: Once | INTRAVENOUS | Status: DC

## 2018-02-22 MED ORDER — METOCLOPRAMIDE HCL 5 MG/ML IJ SOLN
10.0000 mg | Freq: Once | INTRAMUSCULAR | Status: AC
  Administered 2018-02-22: 10 mg via INTRAVENOUS
  Filled 2018-02-22: qty 2

## 2018-02-22 NOTE — ED Notes (Signed)
Patient is resting comfortably. 

## 2018-02-22 NOTE — ED Notes (Signed)
Pt to the er for headache and nausea/vomiting. Pt is [redacted] weeks pregnant and has had nausea the entire pregnancy but is worse today. Pt last emesis just PTA.

## 2018-02-22 NOTE — ED Triage Notes (Signed)
[facility]  Pt reports increased vomiting this morning.  Pt reports due date January 11th, 2020.  Pt states her head is hurting as well.  Pt reports this is the first pregnancy.  Pt is A&Ox4, in NAD.

## 2018-02-22 NOTE — ED Provider Notes (Signed)
[facility] Emergency Department Provider Note    First MD Initiated Contact with Patient 02/22/18 2045     (approximate)  I have reviewed the triage vital signs and the nursing notes.   HISTORY  Chief Complaint Hyperemesis Gravidarum    HPI [name] [name] is a 22 y.o. female   roughly [redacted] weeks pregnant G1, P0 presents the ER with mild headache nausea and vomiting and feeling dehydrated.  Denies any dysuria.  No abdominal pain.  States the headache is mild and frontal feels secondary to not be able to keep anything down.  Is never had a headache like this before but it was not sudden onset no neck stiffness no fevers at home.  Denies any vaginal discharge or vaginal vaginal bleeding.  She does follow-up with [facility] and had a reassuring ultrasound that confirmed IUP.   History reviewed. No pertinent past medical history. No family history on file. History reviewed. No pertinent surgical history. Patient Active Problem List   Diagnosis Date Noted  . Supervision of normal first pregnancy, antepartum 01/18/2018      Prior to Admission medications   Medication Sig Start Date End Date Taking? Authorizing Provider  Prenatal Vit-Fe Fumarate-FA (PRENATAL VITAMIN) 27-0.8 MG TABS Take 1 tablet by mouth daily.    Yes [provider]  doxylamine, Sleep, (UNISOM) 25 MG tablet Take 1 tablet (25 mg total) by mouth 4 (four) times daily. Patient not taking: Reported on 02/20/2018 02/06/18   Natale Milch, MD  Doxylamine-Pyridoxine 10-10 MG TBEC Take 1 tablet by mouth 2 (two) times daily. 02/22/18   Willy Eddy, MD  pyridOXINE (VITAMIN B-6) 25 MG tablet Take 1 tablet (25 mg total) by mouth 4 (four) times daily as needed (nausea and vomiting). Patient not taking: Reported on 02/20/2018 02/06/18   Natale Milch, MD    Allergies Patient has no known allergies.    Social History Social History   Tobacco Use  . Smoking status: Never  Smoker  . Smokeless tobacco: Never Used  Substance Use Topics  . Alcohol use: Never    Frequency: Never  . Drug use: Never    Review of Systems Patient denies headaches, rhinorrhea, blurry vision, numbness, shortness of breath, chest pain, edema, cough, abdominal pain, nausea, vomiting, diarrhea, dysuria, fevers, rashes or hallucinations unless otherwise stated above in HPI. ____________________________________________   PHYSICAL EXAM:  VITAL SIGNS: Vitals:   02/22/18 2026  BP: 111/61  Pulse: 85  Resp: 18  Temp: 99.1 F (37.3 C)  SpO2: 98%    Constitutional: Alert and oriented.  Eyes: Conjunctivae are normal.  Head: Atraumatic. Nose: No congestion/rhinnorhea. Mouth/Throat: Mucous membranes are moist.   Neck: No stridor. Painless ROM.  Cardiovascular: Normal rate, regular rhythm. Grossly normal heart sounds.  Good peripheral circulation. Respiratory: Normal respiratory effort.  No retractions. Lungs CTAB. Gastrointestinal: Soft and nontender. No distention. No abdominal bruits. No CVA tenderness. Genitourinary: deferred Musculoskeletal: No lower extremity tenderness nor edema.  No joint effusions. Neurologic:  Normal speech and language. No gross focal neurologic deficits are appreciated. No facial droop Skin:  Skin is warm, dry and intact. No rash noted. Psychiatric: Mood and affect are normal. Speech and behavior are normal.  ____________________________________________   LABS (all labs ordered are listed, but only abnormal results are displayed)  Results for orders placed or performed during the hospital encounter of 02/22/18 (from the past 24 hour(s))  CBC with Differential/Platelet     Status: Abnormal   Collection  Time: 02/22/18  9:03 PM  Result Value Ref Range   WBC 13.7 (H) 3.6 - 11.0 K/uL   RBC 4.58 3.80 - 5.20 MIL/uL   Hemoglobin 13.0 12.0 - 16.0 g/dL   HCT 16.138.8 09.635.0 - 04.547.0 %   MCV 84.9 80.0 - 100.0 fL   MCH 28.4 26.0 - 34.0 pg   MCHC 33.5 32.0 - 36.0  g/dL   RDW 40.912.5 81.111.5 - 91.414.5 %   Platelets 287 150 - 440 K/uL   Neutrophils Relative % 74 %   Neutro Abs 10.1 (H) 1.4 - 6.5 K/uL   Lymphocytes Relative 15 %   Lymphs Abs 2.1 1.0 - 3.6 K/uL   Monocytes Relative 7 %   Monocytes Absolute 0.9 0.2 - 0.9 K/uL   Eosinophils Relative 3 %   Eosinophils Absolute 0.4 0 - 0.7 K/uL   Basophils Relative 1 %   Basophils Absolute 0.1 0 - 0.1 K/uL  Comprehensive metabolic panel     Status: Abnormal   Collection Time: 02/22/18  9:03 PM  Result Value Ref Range   Sodium 139 135 - 145 mmol/L   Potassium 3.3 (L) 3.5 - 5.1 mmol/L   Chloride 107 98 - 111 mmol/L   CO2 24 22 - 32 mmol/L   Glucose, Bld 99 70 - 99 mg/dL   BUN 8 6 - 20 mg/dL   Creatinine, Ser 7.820.65 0.44 - 1.00 mg/dL   Calcium 9.9 8.9 - 95.610.3 mg/dL   Total Protein 7.3 6.5 - 8.1 g/dL   Albumin 3.8 3.5 - 5.0 g/dL   AST 28 15 - 41 U/L   ALT 26 0 - 44 U/L   Alkaline Phosphatase 64 38 - 126 U/L   Total Bilirubin 0.6 0.3 - 1.2 mg/dL   GFR calc non Af Amer >60 >60 mL/min   GFR calc Af Amer >60 >60 mL/min   Anion gap 8 5 - 15  Lipase, blood     Status: None   Collection Time: 02/22/18  9:03 PM  Result Value Ref Range   Lipase 35 11 - 51 U/L   ____________________________________________ ____________________________________________  RADIOLOGY   ____________________________________________   PROCEDURES  Procedure(s) performed:  Procedures    Critical Care performed: no ____________________________________________   INITIAL IMPRESSION / ASSESSMENT AND PLAN / ED COURSE  Pertinent labs & imaging results that were available during my care of the patient were reviewed by me and considered in my medical decision making (see chart for details).   DDX: Dehydration, hyperemesis, UTI, lecture light abnormality  [name] [name] is a 22 y.o. who presents to the ED with symptoms as described above.  Patient well-appearing she is afebrile hemodynamically stable.  Neuro exam is nonfocal.  This  not clinically consistent with CVT, IPH, SAH or meningitis.  Patient will be given IV fluids as well as Reglan and Tylenol reassess.  Her abdominal exam is soft and benign.  Clinical Course as of Feb 22 2353  Thu Feb 22, 2018  2345 Patient reassessed.  States headache has resolved.  No more nausea.   [PR]    Clinical Course User Index [PR] Willy Eddyobinson, Shatora Weatherbee, MD  ----------------------------------------- 11:52 PM on 02/22/2018 -----------------------------------------  Still awaiting urinalysis.  Patient reassessed is feeling better.  Patient will be signed out to oncoming physician pending assessment of urinalysis.  Patient able to tolerate oral hydration will be stable for outpatient follow-up.   As part of my medical decision making, I reviewed the following data within the electronic medical  record:  Nursing notes reviewed and incorporated, Labs reviewed, notes from prior ED visits.   ____________________________________________   FINAL CLINICAL IMPRESSION(S) / ED DIAGNOSES  Final diagnoses:  Hyperemesis gravidarum  Acute nonintractable headache, unspecified headache type      NEW MEDICATIONS STARTED DURING THIS VISIT:  New Prescriptions   DOXYLAMINE-PYRIDOXINE 10-10 MG TBEC    Take 1 tablet by mouth 2 (two) times daily.     Note:  This document was prepared using Dragon voice recognition software and may include unintentional dictation errors.    Willy Eddy, MD 02/22/18 [phone]

## 2018-02-22 NOTE — ED Notes (Signed)
Family at bedside. 

## 2018-02-23 LAB — URINALYSIS, COMPLETE (UACMP) WITH MICROSCOPIC
Bacteria, UA: NONE SEEN
Bilirubin Urine: NEGATIVE
Hgb urine dipstick: NEGATIVE
Ketones, ur: NEGATIVE mg/dL
Leukocytes, UA: NEGATIVE
NITRITE: NEGATIVE
PH: 6 (ref 5.0–8.0)
Protein, ur: NEGATIVE mg/dL
SPECIFIC GRAVITY, URINE: 1.018 (ref 1.005–1.030)

## 2018-02-23 NOTE — ED Provider Notes (Signed)
-----------------------------------------   12:38 AM on 02/23/2018 -----------------------------------------  Patient's urinalysis has resulted overall normal besides glucose within the urine, however reassuringly patient's blood glucose is normal.  Patient is feeling better we will discharge home.  Patient agreeable to plan of care and will follow up with OB/GYN.   Minna AntisPaduchowski, Krithi Bray, MD 02/23/18 [phone]

## 2018-03-20 ENCOUNTER — Ambulatory Visit (INDEPENDENT_AMBULATORY_CARE_PROVIDER_SITE_OTHER): Payer: Medicaid Other | Admitting: Obstetrics & Gynecology

## 2018-03-20 VITALS — BP 120/80 | Wt 136.0 lb

## 2018-03-20 DIAGNOSIS — Z3400 Encounter for supervision of normal first pregnancy, unspecified trimester: Secondary | ICD-10-CM

## 2018-03-20 DIAGNOSIS — Z3A16 16 weeks gestation of pregnancy: Secondary | ICD-10-CM

## 2018-03-20 DIAGNOSIS — Z3402 Encounter for supervision of normal first pregnancy, second trimester: Secondary | ICD-10-CM

## 2018-03-20 NOTE — Patient Instructions (Signed)

## 2018-03-20 NOTE — Progress Notes (Signed)
  Subjective  Fetal Movement? Yes Contractions? no Leaking Fluid? no Vaginal Bleeding? no  Objective  BP 120/80   Wt 136 lb (61.7 kg)   LMP 11/24/2017 (Exact Date)   BMI 26.56 kg/m  General: NAD Pumonary: no increased work of breathing Abdomen: gravid, non-tender Extremities: no edema Psychiatric: mood appropriate, affect full  Assessment  22 y.o. G1P0000 at 18104w4d by  08/31/2018, by Last Menstrual Period presenting for routine prenatal visit  Plan   Problem List Items Addressed This Visit      Other   Supervision of normal first pregnancy, antepartum   Relevant Orders   US OB Comp + 14 Wk    Other Visit Diagnoses    [redacted] weeks gestation of pregnancy    -  Primary   Relevant Orders   US OB Comp + 14 Wk     [facility]inic [facility] Prenatal Labs  Dating Lmp= 8 wk US Blood type:   O+  Genetic Screen NIPS: normal XX Antibody: Neg  Anatomic US  Rubella: Imm  Varicella: Imm  GTT Third trimester:  RPR:   Neg  Rhogam na HBsAg:   Neg  TDaP vaccine  plan 28 weeks Flu Shot: HIV:   Neg  Baby Food Breast                               GBS: pending  Contraception  Pap: normal  US nv  [name] MajorPaul Karolynn Infantino, MD, Merlinda FrederickFACOG [facility], [facility] 03/20/2018  10:33 AM

## 2018-04-17 ENCOUNTER — Ambulatory Visit (INDEPENDENT_AMBULATORY_CARE_PROVIDER_SITE_OTHER): Payer: Medicaid Other | Admitting: Advanced Practice Midwife

## 2018-04-17 ENCOUNTER — Encounter: Payer: Self-pay | Admitting: Advanced Practice Midwife

## 2018-04-17 ENCOUNTER — Ambulatory Visit (INDEPENDENT_AMBULATORY_CARE_PROVIDER_SITE_OTHER): Payer: Medicaid Other

## 2018-04-17 VITALS — BP 128/66 | Wt 146.0 lb

## 2018-04-17 DIAGNOSIS — Z363 Encounter for antenatal screening for malformations: Secondary | ICD-10-CM

## 2018-04-17 DIAGNOSIS — Z3400 Encounter for supervision of normal first pregnancy, unspecified trimester: Secondary | ICD-10-CM

## 2018-04-17 DIAGNOSIS — Z3402 Encounter for supervision of normal first pregnancy, second trimester: Secondary | ICD-10-CM

## 2018-04-17 DIAGNOSIS — Z3A20 20 weeks gestation of pregnancy: Secondary | ICD-10-CM

## 2018-04-17 DIAGNOSIS — Z3A16 16 weeks gestation of pregnancy: Secondary | ICD-10-CM

## 2018-04-17 NOTE — Progress Notes (Signed)
  Routine Prenatal Care Visit  Subjective  [name] [name] is a 22 y.o. G1P0000 at 1276w4d being seen today for ongoing prenatal care.  She is currently monitored for the following issues for this low-risk pregnancy and has Supervision of normal first pregnancy, antepartum on their problem list.  ----------------------------------------------------------------------------------- Patient reports no complaints.    . Vag. Bleeding: None.  Movement: Present. Denies leaking of fluid.  ----------------------------------------------------------------------------------- The following portions of the patient's history were reviewed and updated as appropriate: allergies, current medications, past family history, past medical history, past social history, past surgical history and problem list. Problem list updated.   Objective  Blood pressure 128/66, weight 146 lb (66.2 kg), last menstrual period 11/24/2017. Pregravid weight 135 lb (61.2 kg) Total Weight Gain 11 lb (4.99 kg) Urinalysis:    unable to leave specimen today  Fetal Status: Fetal Heart Rate (bpm): 164   Movement: Present     Anatomy scan today is incomplete for diaphragm and LVOT and otherwise normal/girl  General:  Alert, oriented and cooperative. Patient is in no acute distress.  Skin: Skin is warm and dry. No rash noted.   Cardiovascular: Normal heart rate noted  Respiratory: Normal respiratory effort, no problems with respiration noted  Abdomen: Soft, gravid, appropriate for gestational age. Pain/Pressure: Absent     Pelvic:  Cervical exam deferred        Extremities: Normal range of motion.  Edema: None  Mental Status: Normal mood and affect. Normal behavior. Normal judgment and thought content.   Assessment   22 y.o. G1P0000 at 6776w4d by  08/31/2018, by Last Menstrual Period presenting for routine prenatal visit  Plan   Pregnancy#1 Problems (from 11/24/17 to present)    Problem Noted Resolved   Supervision of normal first  pregnancy, antepartum 01/18/2018 by Tresea MallGledhill, Kirsi Hugh, CNM No   Overview Addendum 03/20/2018 10:32 AM by Nadara MustardHarris, Robert P, MD    [facility] Prenatal Labs  Dating Lmp= 8 wk US Blood type:     Genetic Screen NIPS: normal XX Antibody:   Anatomic US  Rubella:   Varicella: @VZVIGG @  GTT Third trimester:  RPR:     Rhogam na HBsAg:     TDaP vaccine                       Flu Shot: HIV:     Baby Food Breast                               GBS:   Contraception  Pap:  CBB     CS/VBAC NA   Support Person Boyfriend [name] [name]               Preterm labor symptoms and general obstetric precautions including but not limited to vaginal bleeding, contractions, leaking of fluid and fetal movement were reviewed in detail with the patient.   Return in about 4 weeks (around 05/15/2018) for follow up anatomy scan and rob.  Tresea MallJane Cyntha Brickman, CNM 04/17/2018 11:26 AM

## 2018-04-17 NOTE — Progress Notes (Signed)
ROB Anatomy scan/ IT IS  A GIRL!! 

## 2018-05-15 ENCOUNTER — Ambulatory Visit (INDEPENDENT_AMBULATORY_CARE_PROVIDER_SITE_OTHER): Payer: Medicaid Other

## 2018-05-15 ENCOUNTER — Ambulatory Visit (INDEPENDENT_AMBULATORY_CARE_PROVIDER_SITE_OTHER): Payer: Medicaid Other | Admitting: Obstetrics & Gynecology

## 2018-05-15 VITALS — BP 132/70 | Wt 153.0 lb

## 2018-05-15 DIAGNOSIS — Z3400 Encounter for supervision of normal first pregnancy, unspecified trimester: Secondary | ICD-10-CM

## 2018-05-15 DIAGNOSIS — Z3402 Encounter for supervision of normal first pregnancy, second trimester: Secondary | ICD-10-CM

## 2018-05-15 DIAGNOSIS — Z362 Encounter for other antenatal screening follow-up: Secondary | ICD-10-CM

## 2018-05-15 DIAGNOSIS — Z3A24 24 weeks gestation of pregnancy: Secondary | ICD-10-CM

## 2018-05-15 NOTE — Progress Notes (Signed)
  Subjective  Fetal Movement? yes Contractions? no Leaking Fluid? no Vaginal Bleeding? no  Objective  BP 132/70   Wt 153 lb (69.4 kg)   LMP 11/24/2017 (Exact Date)   BMI 29.88 kg/m  General: NAD Pumonary: no increased work of breathing Abdomen: gravid, non-tender Extremities: no edema Psychiatric: mood appropriate, affect full  Assessment  22 y.o. G1P0000 at 7444w4d by  08/31/2018, by Last Menstrual Period presenting for routine prenatal visit  Plan   Problem List Items Addressed This Visit      Other   Supervision of normal first pregnancy, antepartum    Other Visit Diagnoses    [redacted] weeks gestation of pregnancy    -  Primary   Relevant Orders   28 Week RH+Panel     [facility] Prenatal Labs  Dating Lmp= 8 wk US Blood type: O/Positive/-- (06/18 1110)   Genetic Screen NIPS: normal XX Antibody:Negative (06/18 1110)  Anatomic US WS Rubella: 5.14 (06/18 1110) Varicella:Imm  GTT Third trimester:  RPR: Non Reactive (06/18 1110)   Rhogam na HBsAg: Negative (06/18 1110)   TDaP vaccine                       Flu Shot: HIV: Non Reactive (06/18 1110)   Baby Food Breast                               GBS:   Contraception  Pap:2018, due PP  CBB     CS/VBAC NA   Support Person Boyfriend Antonio     Annamarie MajorPaul Ledford Goodson, MD, FACOG [facility], [facility] 05/15/2018  10:55 AM

## 2018-05-15 NOTE — Patient Instructions (Signed)

## 2018-05-15 NOTE — Progress Notes (Signed)
ROB/ Follow up anatomy  No concerns  Declines flu shot

## 2018-06-12 ENCOUNTER — Other Ambulatory Visit: Payer: Medicaid Other

## 2018-06-12 ENCOUNTER — Ambulatory Visit (INDEPENDENT_AMBULATORY_CARE_PROVIDER_SITE_OTHER): Payer: Medicaid Other | Admitting: Obstetrics & Gynecology

## 2018-06-12 VITALS — BP 130/80 | Wt 160.0 lb

## 2018-06-12 DIAGNOSIS — Z3403 Encounter for supervision of normal first pregnancy, third trimester: Secondary | ICD-10-CM

## 2018-06-12 DIAGNOSIS — O99810 Abnormal glucose complicating pregnancy: Secondary | ICD-10-CM

## 2018-06-12 DIAGNOSIS — Z3A28 28 weeks gestation of pregnancy: Secondary | ICD-10-CM

## 2018-06-12 DIAGNOSIS — Z3A24 24 weeks gestation of pregnancy: Secondary | ICD-10-CM

## 2018-06-12 DIAGNOSIS — Z3400 Encounter for supervision of normal first pregnancy, unspecified trimester: Secondary | ICD-10-CM

## 2018-06-12 DIAGNOSIS — Z87898 Personal history of other specified conditions: Secondary | ICD-10-CM | POA: Insufficient documentation

## 2018-06-12 LAB — POCT URINALYSIS DIPSTICK OB
GLUCOSE, UA: NEGATIVE
POC,PROTEIN,UA: NEGATIVE

## 2018-06-12 NOTE — Progress Notes (Signed)
  Subjective  Fetal Movement? yes Contractions? no Leaking Fluid? no Vaginal Bleeding? no  Objective  BP 130/80   Wt 160 lb (72.6 kg)   LMP 11/24/2017 (Exact Date)   BMI 31.25 kg/m  General: NAD Pumonary: no increased work of breathing Abdomen: gravid, non-tender Extremities: no edema Psychiatric: mood appropriate, affect full  Assessment  22 y.o. G1P0000 at [redacted]w[redacted]d by  08/31/2018, by Last Menstrual Period presenting for routine prenatal visit  Plan   Problem List Items Addressed This Visit      Other   Supervision of normal first pregnancy, antepartum   History of substance use   Relevant Orders   Drug Screen, Urine    Other Visit Diagnoses    [redacted] weeks gestation of pregnancy    -  Primary   Relevant Orders   POC Urinalysis Dipstick OB    Labs today Declines flu shot  Annamarie Major, MD, [facility], [facility] 06/12/2018  10:05 AM

## 2018-06-12 NOTE — Patient Instructions (Signed)
Third Trimester of Pregnancy The third trimester is from week 28 through week 40 (months 7 through 9). The third trimester is a time when the unborn baby (fetus) is growing rapidly. At the end of the ninth month, the fetus is about 20 inches in length and weighs 6-10 pounds. Body changes during your third trimester Your body will continue to go through many changes during pregnancy. The changes vary from woman to woman. During the third trimester:  Your weight will continue to increase. You can expect to gain 25-35 pounds (11-16 kg) by the end of the pregnancy.  You may begin to get stretch marks on your hips, abdomen, and breasts.  You may urinate more often because the fetus is moving lower into your pelvis and pressing on your bladder.  You may develop or continue to have heartburn. This is caused by increased hormones that slow down muscles in the digestive tract.  You may develop or continue to have constipation because increased hormones slow digestion and cause the muscles that push waste through your intestines to relax.  You may develop hemorrhoids. These are swollen veins (varicose veins) in the rectum that can itch or be painful.  You may develop swollen, bulging veins (varicose veins) in your legs.  You may have increased body aches in the pelvis, back, or thighs. This is due to weight gain and increased hormones that are relaxing your joints.  You may have changes in your hair. These can include thickening of your hair, rapid growth, and changes in texture. Some women also have hair loss during or after pregnancy, or hair that feels dry or thin. Your hair will most likely return to normal after your baby is born.  Your breasts will continue to grow and they will continue to become tender. A yellow fluid (colostrum) may leak from your breasts. This is the first milk you are producing for your baby.  Your belly button may stick out.  You may notice more swelling in your hands,  face, or ankles.  You may have increased tingling or numbness in your hands, arms, and legs. The skin on your belly may also feel numb.  You may feel short of breath because of your expanding uterus.  You may have more problems sleeping. This can be caused by the size of your belly, increased need to urinate, and an increase in your body's metabolism.  You may notice the fetus "dropping," or moving lower in your abdomen (lightening).  You may have increased vaginal discharge.  You may notice your joints feel loose and you may have pain around your pelvic bone.  What to expect at prenatal visits You will have prenatal exams every 2 weeks until week 36. Then you will have weekly prenatal exams. During a routine prenatal visit:  You will be weighed to make sure you and the baby are growing normally.  Your blood pressure will be taken.  Your abdomen will be measured to track your baby's growth.  The fetal heartbeat will be listened to.  Any test results from the previous visit will be discussed.  You may have a cervical check near your due date to see if your cervix has softened or thinned (effaced).  You will be tested for Group B streptococcus. This happens between 35 and 37 weeks.  Your health care provider may ask you:  What your birth plan is.  How you are feeling.  If you are feeling the baby move.  If you have had   any abnormal symptoms, such as leaking fluid, bleeding, severe headaches, or abdominal cramping.  If you are using any tobacco products, including cigarettes, chewing tobacco, and electronic cigarettes.  If you have any questions.  Other tests or screenings that may be performed during your third trimester include:  Blood tests that check for low iron levels (anemia).  Fetal testing to check the health, activity level, and growth of the fetus. Testing is done if you have certain medical conditions or if there are problems during the  pregnancy.  Nonstress test (NST). This test checks the health of your baby to make sure there are no signs of problems, such as the baby not getting enough oxygen. During this test, a belt is placed around your belly. The baby is made to move, and its heart rate is monitored during movement.  What is false labor? False labor is a condition in which you feel small, irregular tightenings of the muscles in the womb (contractions) that usually go away with rest, changing position, or drinking water. These are called Braxton Hicks contractions. Contractions may last for hours, days, or even weeks before true labor sets in. If contractions come at regular intervals, become more frequent, increase in intensity, or become painful, you should see your health care provider. What are the signs of labor?  Abdominal cramps.  Regular contractions that start at 10 minutes apart and become stronger and more frequent with time.  Contractions that start on the top of the uterus and spread down to the lower abdomen and back.  Increased pelvic pressure and dull back pain.  A watery or bloody mucus discharge that comes from the vagina.  Leaking of amniotic fluid. This is also known as your "water breaking." It could be a slow trickle or a gush. Let your health care provider know if it has a color or strange odor. If you have any of these signs, call your health care provider right away, even if it is before your due date. Follow these instructions at home: Medicines  Follow your health care provider's instructions regarding medicine use. Specific medicines may be either safe or unsafe to take during pregnancy.  Take a prenatal vitamin that contains at least 600 micrograms (mcg) of folic acid.  If you develop constipation, try taking a stool softener if your health care provider approves. Eating and drinking  Eat a balanced diet that includes fresh fruits and vegetables, whole grains, good sources of protein  such as meat, eggs, or tofu, and low-fat dairy. Your health care provider will help you determine the amount of weight gain that is right for you.  Avoid raw meat and uncooked cheese. These carry germs that can cause birth defects in the baby.  If you have low calcium intake from food, talk to your health care provider about whether you should take a daily calcium supplement.  Eat four or five small meals rather than three large meals a day.  Limit foods that are high in fat and processed sugars, such as fried and sweet foods.  To prevent constipation: ? Drink enough fluid to keep your urine clear or pale yellow. ? Eat foods that are high in fiber, such as fresh fruits and vegetables, whole grains, and beans. Activity  Exercise only as directed by your health care provider. Most women can continue their usual exercise routine during pregnancy. Try to exercise for 30 minutes at least 5 days a week. Stop exercising if you experience uterine contractions.  Avoid heavy   lifting.  Do not exercise in extreme heat or humidity, or at high altitudes.  Wear low-heel, comfortable shoes.  Practice good posture.  You may continue to have sex unless your health care provider tells you otherwise. Relieving pain and discomfort  Take frequent breaks and rest with your legs elevated if you have leg cramps or low back pain.  Take warm sitz baths to soothe any pain or discomfort caused by hemorrhoids. Use hemorrhoid cream if your health care provider approves.  Wear a good support bra to prevent discomfort from breast tenderness.  If you develop varicose veins: ? Wear support pantyhose or compression stockings as told by your healthcare provider. ? Elevate your feet for 15 minutes, 3-4 times a day. Prenatal care  Write down your questions. Take them to your prenatal visits.  Keep all your prenatal visits as told by your health care provider. This is important. Safety  Wear your seat belt at  all times when driving.  Make a list of emergency phone numbers, including numbers for family, friends, the hospital, and police and fire departments. General instructions  Avoid cat litter boxes and soil used by cats. These carry germs that can cause birth defects in the baby. If you have a cat, ask someone to clean the litter box for you.  Do not travel far distances unless it is absolutely necessary and only with the approval of your health care provider.  Do not use hot tubs, steam rooms, or saunas.  Do not drink alcohol.  Do not use any products that contain nicotine or tobacco, such as cigarettes and e-cigarettes. If you need help quitting, ask your health care provider.  Do not use any medicinal herbs or unprescribed drugs. These chemicals affect the formation and growth of the baby.  Do not douche or use tampons or scented sanitary pads.  Do not cross your legs for long periods of time.  To prepare for the arrival of your baby: ? Take prenatal classes to understand, practice, and ask questions about labor and delivery. ? Make a trial run to the hospital. ? Visit the hospital and tour the maternity area. ? Arrange for maternity or paternity leave through employers. ? Arrange for family and friends to take care of pets while you are in the hospital. ? Purchase a rear-facing car seat and make sure you know how to install it in your car. ? Pack your hospital bag. ? Prepare the baby's nursery. Make sure to remove all pillows and stuffed animals from the baby's crib to prevent suffocation.  Visit your dentist if you have not gone during your pregnancy. Use a soft toothbrush to brush your teeth and be gentle when you floss. Contact a health care provider if:  You are unsure if you are in labor or if your water has broken.  You become dizzy.  You have mild pelvic cramps, pelvic pressure, or nagging pain in your abdominal area.  You have lower back pain.  You have persistent  nausea, vomiting, or diarrhea.  You have an unusual or bad smelling vaginal discharge.  You have pain when you urinate. Get help right away if:  Your water breaks before 37 weeks.  You have regular contractions less than 5 minutes apart before 37 weeks.  You have a fever.  You are leaking fluid from your vagina.  You have spotting or bleeding from your vagina.  You have severe abdominal pain or cramping.  You have rapid weight loss or weight gain.    You have shortness of breath with chest pain.  You notice sudden or extreme swelling of your face, hands, ankles, feet, or legs.  Your baby makes fewer than 10 movements in 2 hours.  You have severe headaches that do not go away when you take medicine.  You have vision changes. Summary  The third trimester is from week 28 through week 40, months 7 through 9. The third trimester is a time when the unborn baby (fetus) is growing rapidly.  During the third trimester, your discomfort may increase as you and your baby continue to gain weight. You may have abdominal, leg, and back pain, sleeping problems, and an increased need to urinate.  During the third trimester your breasts will keep growing and they will continue to become tender. A yellow fluid (colostrum) may leak from your breasts. This is the first milk you are producing for your baby.  False labor is a condition in which you feel small, irregular tightenings of the muscles in the womb (contractions) that eventually go away. These are called Braxton Hicks contractions. Contractions may last for hours, days, or even weeks before true labor sets in.  Signs of labor can include: abdominal cramps; regular contractions that start at 10 minutes apart and become stronger and more frequent with time; watery or bloody mucus discharge that comes from the vagina; increased pelvic pressure and dull back pain; and leaking of amniotic fluid. This information is not intended to replace advice  given to you by your health care provider. Make sure you discuss any questions you have with your health care provider. Document Released: 08/02/2001 Document Revised: 01/14/2016 Document Reviewed: 10/09/2012 Elsevier Interactive Patient Education  2017 [employer]  

## 2018-06-13 ENCOUNTER — Telehealth: Payer: Self-pay | Admitting: Obstetrics & Gynecology

## 2018-06-13 LAB — DRUG SCREEN, URINE
AMPHETAMINES, URINE: NEGATIVE ng/mL
BENZODIAZEPINE QUANT UR: NEGATIVE ng/mL
Barbiturate screen, urine: NEGATIVE ng/mL
CANNABINOID QUANT UR: NEGATIVE ng/mL
Cocaine (Metab.): NEGATIVE ng/mL
Opiate Quant, Ur: NEGATIVE ng/mL
PCP Quant, Ur: NEGATIVE ng/mL

## 2018-06-13 LAB — 28 WEEK RH+PANEL
Basophils Absolute: 0.1 10*3/uL (ref 0.0–0.2)
Basos: 1 %
EOS (ABSOLUTE): 0.2 10*3/uL (ref 0.0–0.4)
Eos: 2 %
Gestational Diabetes Screen: 185 mg/dL — ABNORMAL HIGH (ref 65–139)
HIV Screen 4th Generation wRfx: NONREACTIVE
Hematocrit: 36.3 % (ref 34.0–46.6)
Hemoglobin: 12 g/dL (ref 11.1–15.9)
IMMATURE GRANS (ABS): 0.1 10*3/uL (ref 0.0–0.1)
IMMATURE GRANULOCYTES: 1 %
LYMPHS: 13 %
Lymphocytes Absolute: 1.5 10*3/uL (ref 0.7–3.1)
MCH: 28 pg (ref 26.6–33.0)
MCHC: 33.1 g/dL (ref 31.5–35.7)
MCV: 85 fL (ref 79–97)
MONOS ABS: 0.8 10*3/uL (ref 0.1–0.9)
Monocytes: 7 %
NEUTROS PCT: 76 %
Neutrophils Absolute: 8.8 10*3/uL — ABNORMAL HIGH (ref 1.4–7.0)
Platelets: 250 10*3/uL (ref 150–450)
RBC: 4.28 x10E6/uL (ref 3.77–5.28)
RDW: 13.1 % (ref 12.3–15.4)
RPR Ser Ql: NONREACTIVE
WBC: 11.5 10*3/uL — ABNORMAL HIGH (ref 3.4–10.8)

## 2018-06-13 NOTE — Telephone Encounter (Signed)
Patient voicemail not set up unable to leave message

## 2018-06-13 NOTE — Progress Notes (Signed)
Needs 3 hour GTT.  Unable to leave voice mail.  If can reach her please schedule

## 2018-06-13 NOTE — Telephone Encounter (Signed)
Patient is calling for labs results. Please advise. 

## 2018-06-13 NOTE — Telephone Encounter (Signed)
Patient is schedule for 3Gtt on 06/19/18

## 2018-06-19 ENCOUNTER — Other Ambulatory Visit: Payer: Medicaid Other

## 2018-06-19 DIAGNOSIS — O99810 Abnormal glucose complicating pregnancy: Secondary | ICD-10-CM

## 2018-06-19 DIAGNOSIS — Z3400 Encounter for supervision of normal first pregnancy, unspecified trimester: Secondary | ICD-10-CM

## 2018-06-20 LAB — GESTATIONAL GLUCOSE TOLERANCE
GLUCOSE FASTING: 83 mg/dL (ref 65–94)
Glucose, GTT - 1 Hour: 176 mg/dL (ref 65–179)
Glucose, GTT - 2 Hour: 162 mg/dL — ABNORMAL HIGH (ref 65–154)
Glucose, GTT - 3 Hour: 140 mg/dL — ABNORMAL HIGH (ref 65–139)

## 2018-06-21 ENCOUNTER — Telehealth: Payer: Self-pay | Admitting: Obstetrics & Gynecology

## 2018-06-21 ENCOUNTER — Observation Stay
Admission: EM | Admit: 2018-06-21 | Discharge: 2018-06-21 | Disposition: A | Discharge status: Home / Self Care | Payer: Medicaid Other | Attending: Obstetrics and Gynecology | Admitting: Obstetrics and Gynecology

## 2018-06-21 ENCOUNTER — Other Ambulatory Visit: Payer: Self-pay | Admitting: Obstetrics & Gynecology

## 2018-06-21 ENCOUNTER — Other Ambulatory Visit: Payer: Self-pay

## 2018-06-21 DIAGNOSIS — O24410 Gestational diabetes mellitus in pregnancy, diet controlled: Secondary | ICD-10-CM

## 2018-06-21 DIAGNOSIS — Z3A29 29 weeks gestation of pregnancy: Secondary | ICD-10-CM | POA: Insufficient documentation

## 2018-06-21 DIAGNOSIS — Z3400 Encounter for supervision of normal first pregnancy, unspecified trimester: Secondary | ICD-10-CM

## 2018-06-21 DIAGNOSIS — O26893 Other specified pregnancy related conditions, third trimester: Principal | ICD-10-CM | POA: Insufficient documentation

## 2018-06-21 LAB — GLUCOSE, CAPILLARY: GLUCOSE-CAPILLARY: 97 mg/dL (ref 70–99)

## 2018-06-21 NOTE — Telephone Encounter (Signed)
Patient is returning missed call from Dr. Harris please advise  °

## 2018-06-21 NOTE — OB Triage Note (Signed)
Pt is a 22yo G1P0 at [redacted]w[redacted]d that presents from the ED with c/o high glucose test results from Tuesday. Pt states" I want to make sure my baby is okay since my glucose test was high two times." Pt denies VB, LOF and no ctx. Initial FHT 160 with monitors applied and assessing. Pt denies HA, Visual Disturbances and has no epigastric pain.

## 2018-06-21 NOTE — Progress Notes (Signed)
Pt had a reactive NST and discharged home with instructions on Low carb, diabetic friendly diet. Pt verbalized understanding and will follow up with OB at her scheduled appointment on Tuesday November 5th.

## 2018-06-21 NOTE — Progress Notes (Deleted)
Pt had a Reactive NST and discharged home with instructions on Low carb diabetic friendly diet. Pt verbalized understanding and will follow up with OB at her scheduled appointment on Tuesday November 5th.

## 2018-06-26 ENCOUNTER — Ambulatory Visit (INDEPENDENT_AMBULATORY_CARE_PROVIDER_SITE_OTHER): Payer: Medicaid Other | Admitting: Obstetrics & Gynecology

## 2018-06-26 VITALS — BP 120/80 | Wt 161.0 lb

## 2018-06-26 DIAGNOSIS — Z3400 Encounter for supervision of normal first pregnancy, unspecified trimester: Secondary | ICD-10-CM

## 2018-06-26 DIAGNOSIS — Z3A30 30 weeks gestation of pregnancy: Secondary | ICD-10-CM

## 2018-06-26 DIAGNOSIS — O24410 Gestational diabetes mellitus in pregnancy, diet controlled: Secondary | ICD-10-CM | POA: Insufficient documentation

## 2018-06-26 LAB — POCT URINALYSIS DIPSTICK OB
GLUCOSE, UA: NEGATIVE
POC,PROTEIN,UA: NEGATIVE

## 2018-06-26 MED ORDER — PRENATAL VITAMIN 27-0.8 MG PO TABS: 1.0000 | ORAL_TABLET | Freq: Every day | ORAL | 3 refills | Status: DC

## 2018-06-26 NOTE — Patient Instructions (Signed)
Gestational Diabetes Mellitus, Diagnosis  Gestational diabetes (gestational diabetes mellitus) is a temporary form of diabetes that some women develop during pregnancy. It usually occurs around weeks 24-28 of pregnancy and goes away after delivery. Hormonal changes during pregnancy can interfere with insulin production and function, which may result in one or both of these problems:   The pancreas does not make enough of a hormone called insulin.   Cells in the body do not respond properly to insulin that the body makes (insulin resistance).    Normally, insulin allows sugars (glucose) to enter cells in the body. The cells use glucose for energy. Insulin resistance or lack of insulin causes excess glucose to build up in the blood instead of going into cells. As a result, high blood glucose (hyperglycemia) develops.  What are the risks?  If gestational diabetes is treated, it is unlikely to cause problems. If it is not controlled with treatment, it may cause problems during labor and delivery, and some of those problems can be harmful to the unborn baby (fetus) and the mother. Uncontrolled gestational diabetes may also cause the newborn baby to have breathing problems and low blood glucose.  Women who get gestational diabetes are more likely to develop it if they get pregnant again, and they are more likely to develop type 2 diabetes in the future.  What increases the risk?  This condition may be more likely to develop in pregnant women who:   Are older than age 25 during pregnancy.   Have a family history of diabetes.   Are overweight.   Had gestational diabetes in the past.   Have polycystic ovarian syndrome (PCOS).   Are pregnant with twins or multiples.   Are of American-Indian, African-American, Hispanic/Latino, or Asian/Pacific Islander descent.    What are the signs or symptoms?  Most women do not notice symptoms of gestational diabetes because the symptoms are similar to normal symptoms of pregnancy.  Symptoms of gestational diabetes may include:   Increased thirst (polydipsia).   Increased hunger(polyphagia).   Increased urination (polyuria).    How is this diagnosed?    This condition may be diagnosed based on your blood glucose level, which may be checked with one or more of the following blood tests:   A fasting blood glucose (FBG) test. You will not be allowed to eat (you will fast) for at least 8 hours before a blood sample is taken.   A random blood glucose test. This checks your blood glucose at any time of day regardless of when you ate.   An oral glucose tolerance test (OGTT). This is usually done during weeks 24-28 of pregnancy.  ? For this test, you will have an FBG test done. Then, you will drink a beverage that contains glucose. Your blood glucose will be tested again 1 hour after drinking the glucose beverage (1-hour OGTT).  ? If the 1-hour OGTT result is at or above 140 mg/dL (7.8 mmol/L), you will repeat the OGTT. This time, your blood glucose will be tested 3 hours after drinking the glucose beverage (3-hour OGTT).    If you have risk factors, you may be screened for undiagnosed type 2 diabetes at your first health care visit during your pregnancy (prenatal visit).  How is this treated?    Your treatment may be managed by a specialist called an endocrinologist. This condition is treated by following instructions from your health care provider about:   Eating a healthier diet and getting more physical activity.   These changes are the most important ways to manage gestational diabetes.   Checking your blood glucose. Do this as often as told.   Taking diabetes medicines or insulin every day. These will only be prescribed if they are needed.  ? If you use insulin, you may need to adjust your dosage based on how physically active you are and what foods you eat. Your health care provider will tell you how to do this.    Your health care provider will set treatment goals for you based on the  stage of your pregnancy and any other medical conditions you have. Generally, the goal of treatment is to maintain the following blood glucose levels during pregnancy:   Fasting: at or below 95 mg/dL (5.3 mmol/L).   After meals (postprandial):  ? One hour after a meal: at or below 140 mg/dL (7.8 mmol/L).  ? Two hours after a meal: at or below 120 mg/dL (6.7 mmol/L).   A1c (hemoglobin A1c) level: 6-6.5%.    Follow these instructions at home:   Take over-the-counter and prescription medicines only as told by your health care provider.   Manage your weight gain during pregnancy. The amount of weight that you are expected to gain depends on your pre-pregnancy BMI (body mass index).   Keep all follow-up visits as told by your health care provider. This is important.  Consider asking your health care provider these questions:     Do I need to meet with a diabetes educator?   Where can I find a support group for people with diabetes?   What equipment will I need to manage my diabetes at home?   What diabetes medicines do I need, and when should I take them?   How often do I need to check my blood glucose?   What number can I call if I have questions?   When is my next appointment?  Where to find more information:   For more information about diabetes, visit:  ? American Diabetes Association (ADA): www.diabetes.org  ? American Association of Diabetes Educators (AADE): www.diabeteseducator.org/patient-resources  Contact a health care provider if:   Your blood glucose level is at or above 240 mg/dL (13.3 mmol/L).   Your blood glucose level is at or above 200 mg/dL (11.1 mmol/L) and you have ketones in your urine.   You have been sick or have had a fever for 2 days or more and you are not getting better.   You have any of the following problems for more than 6 hours:  ? You cannot eat or drink.  ? You have nausea and vomiting.  ? You have diarrhea.  Get help right away if:   Your blood glucose is below 54  mg/dL (3 mmol/L).   You become confused or you have trouble thinking clearly.   You have difficulty breathing.   You have moderate or large ketone levels in your urine.   Your baby is moving around less than usual.   You develop unusual discharge or bleeding from your vagina.   You start having contractions early (prematurely). Contractions may feel like a tightening in your lower abdomen.  This information is not intended to replace advice given to you by your health care provider. Make sure you discuss any questions you have with your health care provider.  Document Released: 11/14/2000 Document Revised: 01/14/2016 Document Reviewed: 09/11/2015  Elsevier Interactive Patient Education  2018 [employer] Inc.

## 2018-06-26 NOTE — Progress Notes (Signed)
Prenatal Visit Note Date: 06/26/2018 Clinic: [facility]  Subjective:  [name] [name] is a 22 y.o. G1P0000 at [redacted]w[redacted]d being seen today for ongoing prenatal care.  She is currently monitored for the following issues for this high-risk pregnancy and has Supervision of normal first pregnancy, antepartum; History of substance use; and Diet controlled gestational diabetes mellitus (GDM) in third trimester on their problem list.  Patient reports no complaints.    .  .  Movement: Present. Denies leaking of fluid.   The following portions of the patient's history were reviewed and updated as appropriate: allergies, current medications, past family history, past medical history, past social history, past surgical history and problem list. Problem list updated.  Objective:   Vitals:   06/26/18 1021  BP: 120/80  Weight: 161 lb (73 kg)    Fetal Status:     Movement: Present     General:  Alert, oriented and cooperative. Patient is in no acute distress.  Skin: Skin is warm and dry. No rash noted.   Cardiovascular: Normal heart rate noted  Respiratory: Normal respiratory effort, no problems with respiration noted  Abdomen: Soft, gravid, appropriate for gestational age. Pain/Pressure: Absent     Pelvic:  Cervical exam deferred        Extremities: Normal range of motion.     Mental Status: Normal mood and affect. Normal behavior. Normal judgment and thought content.   Urinalysis:      Assessment and Plan:  Pregnancy: G1P0000 at [redacted]w[redacted]d  1. [redacted] weeks gestation of pregnancy Blood transfusion consent form today TDaP deferred to next week per pt request Declines flu shot PNV Rx  2. Diet controlled gestational diabetes mellitus (GDM) in third trimester [facility] for counseling and blood sugar home monitoring this Friday F/U next week to assess BS log Need for medicine d/w pt based on results Risks of DM on pregnancy discussed  Preterm labor symptoms and general obstetric precautions  including but not limited to vaginal bleeding, contractions, leaking of fluid and fetal movement were reviewed in detail with the patient. Please refer to After Visit Summary for other counseling recommendations.  Return in about 1 week (around 07/03/2018) for HROB.  Annamarie Major, MD, [facility], [facility] 06/26/2018  10:42 AM

## 2018-06-26 NOTE — Addendum Note (Signed)
Addended by: Cornelius Moras D on: 06/26/2018 11:03 AM   Modules accepted: Orders

## 2018-06-29 ENCOUNTER — Ambulatory Visit: Payer: Medicaid Other | Admitting: *Deleted

## 2018-07-02 ENCOUNTER — Telehealth: Payer: Self-pay

## 2018-07-02 ENCOUNTER — Encounter: Payer: Medicaid Other | Attending: Obstetrics & Gynecology | Admitting: *Deleted

## 2018-07-02 ENCOUNTER — Encounter: Payer: Self-pay | Admitting: *Deleted

## 2018-07-02 VITALS — BP 116/60 | Ht 60.0 in | Wt 160.2 lb

## 2018-07-02 DIAGNOSIS — Z3A00 Weeks of gestation of pregnancy not specified: Secondary | ICD-10-CM | POA: Diagnosis not present

## 2018-07-02 DIAGNOSIS — O24410 Gestational diabetes mellitus in pregnancy, diet controlled: Secondary | ICD-10-CM | POA: Insufficient documentation

## 2018-07-02 DIAGNOSIS — Z713 Dietary counseling and surveillance: Secondary | ICD-10-CM | POA: Insufficient documentation

## 2018-07-02 NOTE — Patient Instructions (Signed)
Read booklet on Gestational Diabetes Follow Gestational Meal Planning Guidelines Avoid cold cereal for breakfast Avoid fruit juices Complete a 3 Day Food Record and bring to next appointment Check blood sugars 4 x day - before breakfast and 2 hrs after every meal and record  Bring blood sugar log to all appointments Call MD for prescription for meter strips and lancets Strips   Accu-Chek Guide Lancets   Accu-Chek FastClix Purchase urine ketone strips if blood sugars not controlled and check urine ketones every am:  If + increase bedtime snack to 1 protein and 2 carbohydrate servings Walk 20-30 minutes at least 5 x week if permitted by MD

## 2018-07-02 NOTE — Telephone Encounter (Signed)
Pt called triage and said she saw nutritionist today and they advised her to call her doctor to let him know she has her glucose device to monitor her glucose.

## 2018-07-02 NOTE — Progress Notes (Signed)
Diabetes Self-Management Education  Visit Type: First/Initial  Appt. Start Time: 1020 Appt. End Time: 1150  07/02/2018  [name], identified by name and date of birth, is a 22 y.o. female with a diagnosis of Diabetes: Gestational Diabetes.   ASSESSMENT  Blood pressure 116/60, height 5' (1.524 m), weight 160 lb 3.2 oz (72.7 kg), last menstrual period 11/24/2017. Body mass index is 31.29 kg/m.  Diabetes Self-Management Education - 07/02/18 1409      Visit Information   Visit Type  First/Initial      Initial Visit   Diabetes Type  Gestational Diabetes    Are you currently following a meal plan?  Yes    What type of meal plan do you follow?  "baked or grilled foods, no rice, increase in vegetables and fruit"    Are you taking your medications as prescribed?  Yes    Date Diagnosed  Oct 2019      Health Coping   How would you rate your overall health?  Excellent      Psychosocial Assessment   Patient Belief/Attitude about Diabetes  Motivated to manage diabetes    Self-care barriers  None    Self-management support  Doctor's office;Family    Patient Concerns  Nutrition/Meal planning;Glycemic Control    Special Needs  None    Preferred Learning Style  Auditory    Learning Readiness  Change in progress    How often do you need to have someone help you when you read instructions, pamphlets, or other written materials from your doctor or pharmacy?  1 - Never    What is the last grade level you completed in school?  12th      Pre-Education Assessment   Patient understands the diabetes disease and treatment process.  Needs Instruction    Patient understands incorporating nutritional management into lifestyle.  Needs Instruction    Patient undertands incorporating physical activity into lifestyle.  Needs Instruction    Patient understands using medications safely.  Needs Instruction    Patient understands monitoring blood glucose, interpreting and using results  Needs  Instruction    Patient understands prevention, detection, and treatment of acute complications.  Needs Instruction    Patient understands prevention, detection, and treatment of chronic complications.  Needs Instruction    Patient understands how to develop strategies to address psychosocial issues.  Needs Instruction    Patient understands how to develop strategies to promote health/change behavior.  Needs Instruction      Complications   How often do you check your blood sugar?  0 times/day (not testing)   Provided Accu-Chek Guide meter and instructed on use. BG upon return demonstration was 72 mg/dL at 16:10 am - 2 1/2 hrs pp.    Have you had a dilated eye exam in the past 12 months?  No    Have you had a dental exam in the past 12 months?  Yes    Are you checking your feet?  Yes    How many days per week are you checking your feet?  7      Dietary Intake   Breakfast  cereral and milk    Snack (morning)  fruit cups    Lunch  grilled chicken salad; burger and fries before diagnosis    Snack (afternoon)  fruit    Dinner  chicken mostly - occasional beef; potatoes, bread, peas, beans, corn, pasta, green beans, lettuce, tomatoes, carrots, broccoli, greens, cabbage    Beverage(s)  water, juice,  milk      Exercise   Exercise Type  Light (walking / raking leaves)    How many days per week to you exercise?  4    How many minutes per day do you exercise?  90    Total minutes per week of exercise  360      Patient Education   Previous Diabetes Education  No    Disease state   Definition of diabetes, type 1 and 2, and the diagnosis of diabetes;Factors that contribute to the development of diabetes    Nutrition management   Role of diet in the treatment of diabetes and the relationship between the three main macronutrients and blood glucose level;Reviewed blood glucose goals for pre and post meals and how to evaluate the patients' food intake on their blood glucose level.    Physical activity  and exercise   Role of exercise on diabetes management, blood pressure control and cardiac health.    Monitoring  Taught/evaluated SMBG meter.;Purpose and frequency of SMBG.;Taught/discussed recording of test results and interpretation of SMBG.;Ketone testing, when, how.    Chronic complications  Relationship between chronic complications and blood glucose control    Psychosocial adjustment  Identified and addressed patients feelings and concerns about diabetes    Preconception care  Pregnancy and GDM  Role of pre-pregnancy blood glucose control on the development of the fetus;Role of family planning for patients with diabetes;Reviewed with patient blood glucose goals with pregnancy      Individualized Goals (developed by patient)   Reducing Risk  Other (comment)      Outcomes   Expected Outcomes  Demonstrated interest in learning. Expect positive outcomes     Improve blood sugars Decrease medications Prevent diabetes complications Lose weight Lead a healthier lifestyle Become more fit Quit smoking  Individualized Plan for Diabetes Self-Management Training:   Learning Objective:  Patient will have a greater understanding of diabetes self-management. Patient education plan is to attend individual and/or group sessions per assessed needs and concerns.   Plan:   Patient Instructions  Read booklet on Gestational Diabetes Follow Gestational Meal Planning Guidelines Avoid cold cereal for breakfast Avoid fruit juices Complete a 3 Day Food Record and bring to next appointment Check blood sugars 4 x day - before breakfast and 2 hrs after every meal and record  Bring blood sugar log to all appointments Call MD for prescription for meter strips and lancets Strips   Accu-Chek Guide Lancets   Accu-Chek FastClix Purchase urine ketone strips if blood sugars not controlled and check urine ketones every am:  If + increase bedtime snack to 1 protein and 2 carbohydrate servings Walk 20-30 minutes  at least 5 x week if permitted by MD  Expected Outcomes:  Demonstrated interest in learning. Expect positive outcomes  Education material provided:  Gestational Booklet Gestational Meal Planning Guidelines Simple Meal Plan Viewed Gestational Diabetes Video Meter = Accu-Chek Guide 3 Day Food Record Goals for a Healthy Pregnancy  If problems or questions, patient to contact team via:  Sharion Settler, RN, CCM, CDE [phone]  Future DSME appointment:  July 10, 2018 with the dietitian

## 2018-07-03 ENCOUNTER — Other Ambulatory Visit: Payer: Self-pay | Admitting: Advanced Practice Midwife

## 2018-07-03 ENCOUNTER — Telehealth: Payer: Self-pay

## 2018-07-03 ENCOUNTER — Other Ambulatory Visit: Payer: Self-pay | Admitting: Obstetrics & Gynecology

## 2018-07-03 ENCOUNTER — Encounter: Payer: Medicaid Other | Admitting: Obstetrics and Gynecology

## 2018-07-03 MED ORDER — GLUCOSE BLOOD VI STRP: ORAL_STRIP | 12 refills | Status: DC

## 2018-07-03 NOTE — Telephone Encounter (Signed)
[facility] like [facility]

## 2018-07-03 NOTE — Telephone Encounter (Signed)
Patient calling states she is completely out of strips for Accu Chek and needs [facility]ill [facility].  cb [phone]

## 2018-07-03 NOTE — Telephone Encounter (Signed)
-----   Message from Nadara Mustardobert P Harris, MD sent at 07/02/2018  5:28 PM EST ----- Regarding: call pt Let her know to start checking blood sugar at home fasting every morning and 2 hours after breakfast lunch and dinner.  Record in log or on paper and bring to next appt.

## 2018-07-03 NOTE — Telephone Encounter (Signed)
Pt did not answer and voice mailbox not set up 

## 2018-07-03 NOTE — Telephone Encounter (Signed)
Can you send in a rx for her strips [facility]

## 2018-07-03 NOTE — Telephone Encounter (Signed)
Pt needs a rx for the Accu-Chek test strips, she is running low.

## 2018-07-05 ENCOUNTER — Ambulatory Visit (INDEPENDENT_AMBULATORY_CARE_PROVIDER_SITE_OTHER): Payer: Medicaid Other | Admitting: Obstetrics & Gynecology

## 2018-07-05 ENCOUNTER — Encounter: Payer: Self-pay | Admitting: Obstetrics & Gynecology

## 2018-07-05 VITALS — BP 140/80 | Wt 159.0 lb

## 2018-07-05 DIAGNOSIS — Z23 Encounter for immunization: Secondary | ICD-10-CM | POA: Diagnosis not present

## 2018-07-05 DIAGNOSIS — Z3A31 31 weeks gestation of pregnancy: Secondary | ICD-10-CM

## 2018-07-05 DIAGNOSIS — O24410 Gestational diabetes mellitus in pregnancy, diet controlled: Secondary | ICD-10-CM

## 2018-07-05 DIAGNOSIS — Z3400 Encounter for supervision of normal first pregnancy, unspecified trimester: Secondary | ICD-10-CM

## 2018-07-05 MED ORDER — TETANUS-DIPHTH-ACELL PERTUSSIS 5-2.5-18.5 LF-MCG/0.5 IM SUSP
0.5000 mL | Freq: Once | INTRAMUSCULAR | Status: AC
  Administered 2018-07-05: 0.5 mL via INTRAMUSCULAR

## 2018-07-05 NOTE — Addendum Note (Signed)
Addended by: Cornelius MorasPATTERSON, Katrin Grabel D on: 07/05/2018 12:17 PM   Modules accepted: Orders

## 2018-07-05 NOTE — Progress Notes (Signed)
  Subjective  Fetal Movement? yes Contractions? no Leaking Fluid? no Vaginal Bleeding? no BS LOG- FBS<95, one 2hrPPBS>120 Objective  BP 140/80   Wt 159 lb (72.1 kg)   LMP 11/24/2017 (Exact Date)   BMI 31.05 kg/m  General: NAD Pumonary: no increased work of breathing Abdomen: gravid, non-tender Extremities: no edema Psychiatric: mood appropriate, affect full  Assessment  22 y.o. G1P0000 at 3445w6d by  08/31/2018, by Last Menstrual Period presenting for routine prenatal visit  Plan   Problem List Items Addressed This Visit      Endocrine   Diet controlled gestational diabetes mellitus (GDM) in third trimester - Primary     Other   Supervision of normal first pregnancy, antepartum    Other Visit Diagnoses    [redacted] weeks gestation of pregnancy         Current Diabetic Medications:  None, just diagnosed w GDMA1   Required Referrals for A1GDM or A2GDM: [x ] Diabetes Education and Testing Supplies [x ] Nutrition Cousult  Antenatal Testing Class of DM U/S NST/AFI DELIVERY  Diabetes   A1 - good control - O24.410    A2 - good control - O24.419      A2  - poor control or poor compliance - O24.419, E11.65   (Macrosomia or polyhydramnios) **E11.65 is extra code for poor control**    A2/B - O24.919  and B-C O24.319  Poor control B-C or D-R-F-T - O24.319  or  Type I DM - O24.019  Plan for 36 weeks  20-38  20-24-28-32-36   20-24-28-32-35-38//fetal echo  20-24-27-30-33-36-38//fetal echo  40  32//2 x wk  32//2 x wk   32//2 x wk  28//BPP wkly then 32//2 x wk  40  39  PRN   39  PRN   TDaP today  Annamarie MajorPaul Davona Kinoshita, MD, Merlinda FrederickFACOG [facility], [facility] 07/05/2018  12:10 PM

## 2018-07-10 ENCOUNTER — Encounter: Payer: Self-pay | Admitting: Dietician

## 2018-07-10 ENCOUNTER — Encounter: Payer: Medicaid Other | Admitting: Dietician

## 2018-07-10 VITALS — BP 118/60 | Ht 60.0 in | Wt 159.7 lb

## 2018-07-10 DIAGNOSIS — O24410 Gestational diabetes mellitus in pregnancy, diet controlled: Secondary | ICD-10-CM

## 2018-07-10 DIAGNOSIS — Z713 Dietary counseling and surveillance: Secondary | ICD-10-CM | POA: Diagnosis not present

## 2018-07-10 NOTE — Progress Notes (Signed)
   Patient's BG record indicates BGs improving; fasting BGs are within goal, ranging 79-89; post-meal BGs are fluctuating, ranging 75-187 (187 was after eating a high-carb restaurant meal).   Patient's food diary indicates varying amounts of carbohydrate in meals; patient reports drinking milk with every meal.   Patient reports allergy to peanuts, and has not been tolerating eggs during pregnancy.   Provided 1700kcal meal plan, and wrote individualized menus based on patient's food preferences.  Instructed patient on basic meal planning using food lists and menus, and advised controlled carb intake.   Instructed patient on food safety, including avoidance of Listeriosis, and limiting mercury from fish (patient does not eat fish).  Discussed importance of maintaining healthy lifestyle habits to reduce risk of Type 2 DM as well as Gestational DM with any future pregnancies.  Advised patient to use any remaining testing supplies to test some BGs after delivery, and to have BG tested ideally annually, as well as prior to attempting future pregnancies.

## 2018-07-10 NOTE — Patient Instructions (Signed)
   Continue to make healthy food choices and stay active, great job!  Count each 8oz of milk as 1 carb serving; keep to 3 servings or 45grams with each meal.   Protein portions and low-carb vegetable portions are flexible, so you can increase those foods to avoid excess hunger between meals.   If blood sugar should drop low, drink 8oz milk or eat a fruit such as a fruit cup or applesauce to increase back to normal within 10-15 minutes; then eat a full meal or snack with protein.

## 2018-07-12 ENCOUNTER — Ambulatory Visit (INDEPENDENT_AMBULATORY_CARE_PROVIDER_SITE_OTHER): Payer: Medicaid Other | Admitting: Obstetrics & Gynecology

## 2018-07-12 VITALS — BP 128/80 | Wt 161.0 lb

## 2018-07-12 DIAGNOSIS — Z3A32 32 weeks gestation of pregnancy: Secondary | ICD-10-CM

## 2018-07-12 DIAGNOSIS — O0993 Supervision of high risk pregnancy, unspecified, third trimester: Secondary | ICD-10-CM | POA: Insufficient documentation

## 2018-07-12 DIAGNOSIS — O24410 Gestational diabetes mellitus in pregnancy, diet controlled: Secondary | ICD-10-CM

## 2018-07-12 LAB — POCT URINALYSIS DIPSTICK OB: Glucose, UA: NEGATIVE

## 2018-07-12 NOTE — Progress Notes (Signed)
Prenatal Visit Note Date: 07/12/2018 Clinic: [facility]  Subjective:  [name] [name] is a 22 y.o. G1P0000 at 6473w6d being seen today for ongoing prenatal care.  She is currently monitored for the following issues for this high-risk pregnancy and has History of substance use; Diet controlled gestational diabetes mellitus (GDM) in third trimester; and High-risk pregnancy, third trimester on their problem list.  Patient reports no complaints and BS LOG reviewed and well done (4 out of 26 readings abn); no pain or nausea or bleeding; good FM.   Contractions: Not present. Vag. Bleeding: None.  Movement: Present. Denies leaking of fluid.   The following portions of the patient's history were reviewed and updated as appropriate: allergies, current medications, past family history, past medical history, past social history, past surgical history and problem list. Problem list updated.  Objective:   Vitals:   07/12/18 1010  BP: 128/80  Weight: 161 lb (73 kg)    Fetal Status:     Movement: Present     General:  Alert, oriented and cooperative. Patient is in no acute distress.  Skin: Skin is warm and dry. No rash noted.   Cardiovascular: Normal heart rate noted  Respiratory: Normal respiratory effort, no problems with respiration noted  Abdomen: Soft, gravid, appropriate for gestational age. Pain/Pressure: Absent     Pelvic:  Cervical exam deferred        Extremities: Normal range of motion.     Mental Status: Normal mood and affect. Normal behavior. Normal judgment and thought content.   Assessment and Plan:  Pregnancy: G1P0000 at 7173w6d  1. [redacted] weeks gestation of pregnancy Declines flu shot  2. Diet controlled gestational diabetes mellitus (GDM) in third trimester Daily BS log May change to twice daily readings once consistent pattern of normals Diet discussed US 36 weeks for growth  3. High-risk pregnancy, third trimester Denies substance use  Preterm labor symptoms and general  obstetric precautions including but not limited to vaginal bleeding, contractions, leaking of fluid and fetal movement were reviewed in detail with the patient. Please refer to After Visit Summary for other counseling recommendations.  Return in about 1 week (around 07/19/2018) for HROB. Clinic [facility] Prenatal Labs  Dating US Blood type: O/Positive/-- (06/18 1110)   Genetic Screen NIPS: normal XX Antibody:Negative (06/18 1110)  Anatomic US WS Rubella: 5.14 (06/18 1110) Varicella: @VZVIGG @  GTT 185- GDMA1 dx  RPR: Non Reactive (10/22 1048)   Rhogam na HBsAg: Negative (06/18 1110)   TDaP vaccine     11/14    Flu Shot: no HIV: Non Reactive (10/22 1048)   Baby Food    Breast and Bottle planned   GBS:   Contraception    Depo Pap:2018, due PP  CBB    no   CS/VBAC   no   Support Person   Antonio    Annamarie MajorPaul Sebrina Kessner, MD, FACOG [facility], [facility] 07/12/2018  10:25 AM

## 2018-07-24 ENCOUNTER — Ambulatory Visit (INDEPENDENT_AMBULATORY_CARE_PROVIDER_SITE_OTHER): Payer: Medicaid Other | Admitting: Obstetrics and Gynecology

## 2018-07-24 VITALS — BP 138/82 | Wt 164.0 lb

## 2018-07-24 DIAGNOSIS — O24410 Gestational diabetes mellitus in pregnancy, diet controlled: Secondary | ICD-10-CM

## 2018-07-24 DIAGNOSIS — O0993 Supervision of high risk pregnancy, unspecified, third trimester: Secondary | ICD-10-CM

## 2018-07-24 DIAGNOSIS — Z3400 Encounter for supervision of normal first pregnancy, unspecified trimester: Secondary | ICD-10-CM

## 2018-07-24 DIAGNOSIS — Z87898 Personal history of other specified conditions: Secondary | ICD-10-CM

## 2018-07-24 DIAGNOSIS — Z3A34 34 weeks gestation of pregnancy: Secondary | ICD-10-CM

## 2018-07-24 LAB — POCT URINALYSIS DIPSTICK OB
GLUCOSE, UA: NEGATIVE
POC,PROTEIN,UA: NEGATIVE

## 2018-07-24 NOTE — Addendum Note (Signed)
Addended by: SwazilandJORDAN, Alfio Loescher B on: 07/24/2018 11:06 AM   Modules accepted: Orders

## 2018-07-24 NOTE — Progress Notes (Signed)
ROB

## 2018-07-24 NOTE — Progress Notes (Signed)
    Routine Prenatal Care Visit  Subjective  [name] [name] is a 22 y.o. G1P0000 at 4332w4d being seen today for ongoing prenatal care.  She is currently monitored for the following issues for this high-risk pregnancy and has Supervision of normal first pregnancy, antepartum; History of substance use; Diet controlled gestational diabetes mellitus (GDM) in third trimester; and High-risk pregnancy, third trimester on their problem list.  ----------------------------------------------------------------------------------- Patient reports no complaints.   Contractions: Not present. Vag. Bleeding: None.  Movement: Present. Denies leaking of fluid.  ----------------------------------------------------------------------------------- The following portions of the patient's history were reviewed and updated as appropriate: allergies, current medications, past family history, past medical history, past social history, past surgical history and problem list. Problem list updated.   Objective  Blood pressure 138/82, weight 164 lb (74.4 kg), last menstrual period 11/24/2017. Pregravid weight 135 lb (61.2 kg) Total Weight Gain 29 lb (13.2 kg)  Body mass index is 32.03 kg/m.  Urinalysis:      Fetal Status: Fetal Heart Rate (bpm): 145 Fundal Height: 34 cm Movement: Present  Presentation: Homero FellersFrank Breech  General:  Alert, oriented and cooperative. Patient is in no acute distress.  Skin: Skin is warm and dry. No rash noted.   Cardiovascular: Normal heart rate noted  Respiratory: Normal respiratory effort, no problems with respiration noted  Abdomen: Soft, gravid, appropriate for gestational age. Pain/Pressure: Absent     Pelvic:  Cervical exam deferred        Extremities: Normal range of motion.     ental Status: Normal mood and affect. Normal behavior. Normal judgment and thought content.     Assessment   22 y.o. G1P0000 at 5432w4d by  08/31/2018, by Last Menstrual Period presenting for routine prenatal  visit  Plan   Pregnancy#1 Problems (from 11/24/17 to present)    Problem Noted Resolved   Supervision of normal first pregnancy, antepartum 01/18/2018 by Tresea MallGledhill, Jane, CNM No   Overview Addendum 06/21/2018  2:56 PM by Nadara MustardHarris, Robert P, MD    Clinic [facility] Prenatal Labs  Dating Lmp= 8 wk US Blood type: O/Positive/-- (06/18 1110)   Genetic Screen NIPS: normal XX Antibody:Negative (06/18 1110)  Anatomic US WS Rubella: 5.14 (06/18 1110) Varicella:Imm  GTT Third trimester: ABNORMAL RPR: Non Reactive (06/18 1110)   Rhogam na HBsAg: Negative (06/18 1110)   TDaP vaccine         Flu Shot: declines HIV: Non Reactive (06/18 1110)   Baby Food Breast                               GBS:   Contraception  Pap:2018, due PP  CBB     CS/VBAC NA   Support Person Boyfriend Antonio               Gestational age appropriate obstetric precautions including but not limited to vaginal bleeding, contractions, leaking of fluid and fetal movement were reviewed in detail with the patient.   - no log states values still running same as before - growth scan next visit  Return in about 2 weeks (around 08/07/2018) for ROB and growth scan.  Vena AustriaAndreas Rejeana Fadness, MD, Evern CoreFACOG [facility], [facility] 07/24/2018, 11:02 AM

## 2018-07-26 NOTE — Discharge Summary (Signed)
Physician Discharge Summary   Patient ID: [name] [name] [id number] 22 y.o. 03/12/1996  Admit date: 06/21/2018  Discharge date and time: 06/21/2018  6:36 PM   Admitting Physician: Natale Milchhristanna R Rasool Rommel, MD   Discharge Physician: Adelene Idlerhristanna Cheney Gosch MD  Admission Diagnoses: [redacted] wks pregnant/high blood sugar  Discharge Diagnoses: same as above  Admission Condition: good  Discharged Condition: good  Indication for Admission: Triage evaluation  Hospital Course: Patient has recently started monitoring her blood glucose at home. She had an elevated reading above 120 and was concerned so she presented to triage to make sure her infant was okay.  Fetal tracing was reactive. Spoke to patient and provided reassurance. She has planned follow up with Dr. Tiburcio PeaHarris to review her glucose log and make a management plan. Blood glucose here today was 97.   Consults: None  Significant Diagnostic Studies: none  Treatments: none  Discharge Exam: BP 104/86   Pulse 100   Temp 98.7 F (37.1 C) (Oral)   Resp 15   Ht 5' (1.524 m)   Wt 72.1 kg   LMP 11/24/2017 (Exact Date)   BMI 31.05 kg/m   General Appearance:    Alert, cooperative, no distress, appears stated age  Head:    Normocephalic, without obvious abnormality, atraumatic  Eyes:    PERRL, conjunctiva/corneas clear, EOM's intact, fundi    benign, both eyes  Ears:    Normal TM's and external ear canals, both ears  Nose:   Nares normal, septum midline, mucosa normal, no drainage    or sinus tenderness  Throat:   Lips, mucosa, and tongue normal; teeth and gums normal  Neck:   Supple, symmetrical, trachea midline, no adenopathy;    thyroid:  no enlargement/tenderness/nodules; no carotid   bruit or JVD  Back:     Symmetric, no curvature, ROM normal, no CVA tenderness  Lungs:     Clear to auscultation bilaterally, respirations unlabored  Chest Wall:    No tenderness or deformity   Heart:    Regular rate and rhythm, S1 and S2 normal, no murmur,  rub   or gallop  Breast Exam:    No tenderness, masses, or nipple abnormality  Abdomen:     Soft, non-tender, bowel sounds active all four quadrants,    no masses, no organomegaly  Genitalia:    Normal female without lesion, discharge or tenderness  Rectal:    Normal tone, normal prostate, no masses or tenderness;   guaiac negative stool  Extremities:   Extremities normal, atraumatic, no cyanosis or edema  Pulses:   2+ and symmetric all extremities  Skin:   Skin color, texture, turgor normal, no rashes or lesions  Lymph nodes:   Cervical, supraclavicular, and axillary nodes normal  Neurologic:   CNII-XII intact, normal strength, sensation and reflexes    throughout    Disposition:   Patient Instructions:  Allergies as of 06/21/2018   No Known Allergies     Medication List    You have not been prescribed any medications.    Activity: activity as tolerated Diet: regular diet Wound Care: none needed  Follow-up with Dr. Tiburcio PeaHarris in 1 week.  Signed: Cacie Gaskins R Coltyn Hanning 07/26/2018 1:00 AM

## 2018-08-07 ENCOUNTER — Ambulatory Visit (INDEPENDENT_AMBULATORY_CARE_PROVIDER_SITE_OTHER): Payer: Medicaid Other | Admitting: Obstetrics & Gynecology

## 2018-08-07 ENCOUNTER — Ambulatory Visit (INDEPENDENT_AMBULATORY_CARE_PROVIDER_SITE_OTHER): Payer: Medicaid Other

## 2018-08-07 ENCOUNTER — Other Ambulatory Visit (HOSPITAL_COMMUNITY)
Admission: RE | Admit: 2018-08-07 | Discharge: 2018-08-07 | Disposition: A | Discharge status: Home / Self Care | Payer: Medicaid Other | Source: Ambulatory Visit | Attending: Obstetrics & Gynecology | Admitting: Obstetrics & Gynecology

## 2018-08-07 VITALS — BP 130/80 | Wt 168.0 lb

## 2018-08-07 DIAGNOSIS — O0993 Supervision of high risk pregnancy, unspecified, third trimester: Secondary | ICD-10-CM

## 2018-08-07 DIAGNOSIS — Z113 Encounter for screening for infections with a predominantly sexual mode of transmission: Secondary | ICD-10-CM | POA: Diagnosis not present

## 2018-08-07 DIAGNOSIS — Z3400 Encounter for supervision of normal first pregnancy, unspecified trimester: Secondary | ICD-10-CM

## 2018-08-07 DIAGNOSIS — Z3A36 36 weeks gestation of pregnancy: Secondary | ICD-10-CM

## 2018-08-07 DIAGNOSIS — O24410 Gestational diabetes mellitus in pregnancy, diet controlled: Secondary | ICD-10-CM

## 2018-08-07 DIAGNOSIS — Z87898 Personal history of other specified conditions: Secondary | ICD-10-CM

## 2018-08-07 NOTE — Progress Notes (Signed)
  Subjective  Fetal Movement? yes Contractions? no Leaking Fluid? no Vaginal Bleeding? no  Objective  BP 130/80   Wt 168 lb (76.2 kg)   LMP 11/24/2017 (Exact Date)   BMI 32.81 kg/m  General: NAD Pumonary: no increased work of breathing Abdomen: gravid, non-tender Extremities: no edema Psychiatric: mood appropriate, affect full  Assessment  22 y.o. G1P0000 at 5161w4d by  08/31/2018, by Last Menstrual Period presenting for routine prenatal visit  Plan   Problem List Items Addressed This Visit      Endocrine   Diet controlled gestational diabetes mellitus (GDM) in third trimester   Relevant Orders   US OB Limited     Other   Supervision of normal first pregnancy, antepartum - Primary    Other Visit Diagnoses    [redacted] weeks gestation of pregnancy       Relevant Orders   Culture, beta strep (group b only)    GDMA1: Daily BS log Change to twice daily readings once consistent pattern of normals Diet discussed US 36 weeks for growth normal Review of ULTRASOUND.    I have personally reviewed images and report of [facility].    Plan of management to be discussed with patient.  High-risk pregnancy, third trimester Denies substance use  Preterm labor symptoms and general obstetric precautions including but not limited to vaginal bleeding, contractions, leaking of fluid and fetal movement were reviewed in detail with the patient. Please refer to After Visit Summary for other counseling recommendations.  Return in about 1 week (around 07/19/2018) for HROB. Clinic [facility] Prenatal Labs  Dating US Blood type: O/Positive/-- (06/18 1110)   Genetic Screen NIPS: normal XX Antibody:Negative (06/18 1110)  Anatomic US WS Rubella: 5.14 (06/18 1110) Varicella: @VZVIGG @  GTT 185- GDMA1 dx  RPR: Non Reactive (10/22 1048)   Rhogam na HBsAg: Negative (06/18 1110)   TDaP vaccine     11/14    Flu Shot: no HIV: Non Reactive (10/22 1048)   Baby Food    Breast and Bottle  planned   GBS:   Contraception    Depo Pap:2018, due PP  CBB    no   CS/VBAC   no   Support Person   Antonio    Annamarie MajorPaul Harris, MD, FACOG [facility], [facility] 08/07/2018  11:32 AM

## 2018-08-07 NOTE — Addendum Note (Signed)
Addended by: Nadara MustardHARRIS, Wyley Hack P on: 08/07/2018 02:20 PM   Modules accepted: Orders

## 2018-08-08 LAB — GC/CHLAMYDIA PROBE AMP (~~LOC~~) NOT AT ARMC
Chlamydia: NEGATIVE
NEISSERIA GONORRHEA: NEGATIVE

## 2018-08-11 LAB — CULTURE, BETA STREP (GROUP B ONLY): STREP GP B CULTURE: NEGATIVE

## 2018-08-17 ENCOUNTER — Observation Stay
Admission: EM | Admit: 2018-08-17 | Discharge: 2018-08-17 | Disposition: A | Discharge status: Home / Self Care | Payer: Medicaid Other | Source: Home / Self Care | Admitting: Obstetrics and Gynecology

## 2018-08-17 ENCOUNTER — Ambulatory Visit (INDEPENDENT_AMBULATORY_CARE_PROVIDER_SITE_OTHER): Payer: Medicaid Other | Admitting: Advanced Practice Midwife

## 2018-08-17 ENCOUNTER — Other Ambulatory Visit: Payer: Self-pay

## 2018-08-17 ENCOUNTER — Ambulatory Visit (INDEPENDENT_AMBULATORY_CARE_PROVIDER_SITE_OTHER): Payer: Medicaid Other

## 2018-08-17 ENCOUNTER — Encounter: Payer: Self-pay | Admitting: Advanced Practice Midwife

## 2018-08-17 VITALS — BP 128/88 | Wt 173.0 lb

## 2018-08-17 DIAGNOSIS — O24410 Gestational diabetes mellitus in pregnancy, diet controlled: Secondary | ICD-10-CM

## 2018-08-17 DIAGNOSIS — Z3A38 38 weeks gestation of pregnancy: Secondary | ICD-10-CM

## 2018-08-17 DIAGNOSIS — O471 False labor at or after 37 completed weeks of gestation: Secondary | ICD-10-CM | POA: Insufficient documentation

## 2018-08-17 DIAGNOSIS — Z3400 Encounter for supervision of normal first pregnancy, unspecified trimester: Secondary | ICD-10-CM

## 2018-08-17 DIAGNOSIS — O0993 Supervision of high risk pregnancy, unspecified, third trimester: Secondary | ICD-10-CM

## 2018-08-17 LAB — FETAL NONSTRESS TEST

## 2018-08-17 LAB — GLUCOSE, CAPILLARY: Glucose-Capillary: 113 mg/dL — ABNORMAL HIGH (ref 70–99)

## 2018-08-17 NOTE — Progress Notes (Signed)
No vb. No lof. NST /AFI today.  

## 2018-08-17 NOTE — OB Triage Note (Signed)
Pt presents to L&D for complaints of ctx and spotting when she goes to the bathroom. Pt denies LOF, or bright red vaginal bleeding. Pt states her cervix was checked in the office this morning and she noticed the spotting after that. She states her ctx are q7610min apart. Pt states + FM. Monitors applied and assessing.

## 2018-08-17 NOTE — Progress Notes (Signed)
Routine Prenatal Care Visit  Subjective  [name] [name] is a 22 y.o. G1P0000 at 4878w0d being seen today for ongoing prenatal care.  She is currently monitored for the following issues for this high-risk pregnancy and has Supervision of normal first pregnancy, antepartum; History of substance use; Diet controlled gestational diabetes mellitus (GDM) in third trimester; and High-risk pregnancy, third trimester on their problem list.  ----------------------------------------------------------------------------------- Patient reports regular contractions since earlier this morning that were every 15 minutes. They are now about every 4-6 minutes apart. Reviewed labor precautions and recommended timing the contractions and after an hour of every 5 minutes or closer that she go to the hospital for evaluation. She is tearful from the pain. She reports all blood sugar values are in normal range.  Contractions: Regular. Vag. Bleeding: None.  Movement: Present. Denies leaking of fluid.  ----------------------------------------------------------------------------------- The following portions of the patient's history were reviewed and updated as appropriate: allergies, current medications, past family history, past medical history, past social history, past surgical history and problem list. Problem list updated.   Objective  Blood pressure 128/88, weight 173 lb (78.5 kg), last menstrual period 11/24/2017. Pregravid weight 135 lb (61.2 kg) Total Weight Gain 38 lb (17.2 kg) Urinalysis: Urine Protein    Urine Glucose    Fetal Status: Fetal Heart Rate (bpm): 150   Movement: Present  Presentation: Vertex  AFI 17.41 NST is reactive 20 minute tracing, 150 bpm, moderate variability, +accelerations, -decelerations  General:  Alert, oriented and cooperative. Patient is in no acute distress.  Skin: Skin is warm and dry. No rash noted.   Cardiovascular: Normal heart rate noted  Respiratory: Normal respiratory  effort, no problems with respiration noted  Abdomen: Soft, gravid, appropriate for gestational age. Pain/Pressure: Present     Pelvic:  Cervical exam performed difficult due to patient discomfort with exam. 1/90/-2  Extremities: Normal range of motion.  Edema: None  Mental Status: Normal mood and affect. Normal behavior. Normal judgment and thought content.   Assessment   22 y.o. G1P0000 at 4278w0d by  08/31/2018, by Last Menstrual Period presenting for routine prenatal visit  Plan   Pregnancy#1 Problems (from 11/24/17 to present)    Problem Noted Resolved   Supervision of normal first pregnancy, antepartum 01/18/2018 by Tresea MallGledhill, Tsutomu Barfoot, CNM No   Overview Addendum 06/21/2018  2:56 PM by Nadara MustardHarris, Robert P, MD    [facility] Prenatal Labs  Dating Lmp= 8 wk US Blood type: O/Positive/-- (06/18 1110)   Genetic Screen NIPS: normal XX Antibody:Negative (06/18 1110)  Anatomic US WS Rubella: 5.14 (06/18 1110) Varicella:Imm  GTT Third trimester: ABNORMAL RPR: Non Reactive (06/18 1110)   Rhogam na HBsAg: Negative (06/18 1110)   TDaP vaccine         Flu Shot: declines HIV: Non Reactive (06/18 1110)   Baby Food Breast                               GBS:   Contraception  Pap:2018, due PP  CBB     CS/VBAC NA   Support Person Boyfriend Antonio               Term labor symptoms and general obstetric precautions including but not limited to vaginal bleeding, contractions, leaking of fluid and fetal movement were reviewed in detail with the patient.   Return in about 1 week (around 08/24/2018) for afi/nst/rob.  Tresea MallJane Nataline Basara, CNM 08/17/2018 11:55 AM

## 2018-08-17 NOTE — Final Progress Note (Signed)
Physician Final Progress Note  Patient ID: [name] [name] MRN: [id number] DOB/AGE: 24/03/1996 24 y.o.  Admit date: 08/17/2018 Admitting provider: Natale Milchhristanna R Schuman, MD/ Gasper Lloydolleen L. Sharen HonesGutierrez, CNM Discharge date: 08/18/2018   Admission Diagnoses: IUP at 38 weeks with contractions and bloody show  Discharge Diagnoses:  Same as above Early vs prodromal labor  Consults: None  Significant Findings/ Diagnostic Studies:  22 year old G1 P0 with EDC=08/31/2018 presented at 38 weeks with complaints of contractions that have become more frequent and intense since this morning's prenatal visit. She had a cervical exam at that time: 1/90%.Since the exam she has also noticed spotting when she urinates. Her cervix on arrival was 1.5cm/ 80%/-1 to -2 and there was a bloody show present.  Contractions every 4-5 minutes apart and palpating mild. Vital signs were normal with blood pressures of 124/68 and 110/69. Fetal heart tracing was reactive. After ambulation and monitoring for about 5 hours there was no further change in the cervix and the contractions became more irregular. She was discharged home with labor precautions. Procedures: none  Discharge Condition: stable  Disposition:   Diet: Regular diet  Discharge Activity: Activity as tolerated   Allergies as of 08/17/2018      Reactions   Peanut-containing Drug Products Swelling   Tongue swelling      Medication List    ASK your doctor about these medications   glucose blood test strip Use as instructed   Prenatal Vitamin 27-0.8 MG Tabs Take 1 tablet by mouth daily.        Total time spent taking care of this patient: 20 minutes  Signed: [name] Maurico Perrell 08/18/2018, 7:18 PM

## 2018-08-17 NOTE — Progress Notes (Signed)
Pt given discharge instructions from CNM and RN. Pt verbalized understanding and all questions answered.

## 2018-08-18 ENCOUNTER — Inpatient Hospital Stay: Payer: Medicaid Other | Admitting: Anesthesiology

## 2018-08-18 ENCOUNTER — Encounter: Payer: Self-pay | Admitting: *Deleted

## 2018-08-18 ENCOUNTER — Inpatient Hospital Stay
Admission: EM | Admit: 2018-08-18 | Discharge: 2018-08-20 | DRG: 806 | Disposition: A | Discharge status: Home / Self Care | Payer: Medicaid Other | Attending: Obstetrics and Gynecology | Admitting: Obstetrics and Gynecology

## 2018-08-18 ENCOUNTER — Other Ambulatory Visit: Payer: Self-pay

## 2018-08-18 DIAGNOSIS — O99324 Drug use complicating childbirth: Secondary | ICD-10-CM | POA: Diagnosis present

## 2018-08-18 DIAGNOSIS — O0993 Supervision of high risk pregnancy, unspecified, third trimester: Secondary | ICD-10-CM

## 2018-08-18 DIAGNOSIS — Z87898 Personal history of other specified conditions: Secondary | ICD-10-CM

## 2018-08-18 DIAGNOSIS — Z3400 Encounter for supervision of normal first pregnancy, unspecified trimester: Secondary | ICD-10-CM

## 2018-08-18 DIAGNOSIS — O99214 Obesity complicating childbirth: Secondary | ICD-10-CM | POA: Diagnosis not present

## 2018-08-18 DIAGNOSIS — F1290 Cannabis use, unspecified, uncomplicated: Secondary | ICD-10-CM | POA: Diagnosis present

## 2018-08-18 DIAGNOSIS — O24410 Gestational diabetes mellitus in pregnancy, diet controlled: Secondary | ICD-10-CM | POA: Diagnosis present

## 2018-08-18 DIAGNOSIS — R112 Nausea with vomiting, unspecified: Secondary | ICD-10-CM | POA: Diagnosis present

## 2018-08-18 DIAGNOSIS — O1404 Mild to moderate pre-eclampsia, complicating childbirth: Secondary | ICD-10-CM | POA: Diagnosis present

## 2018-08-18 DIAGNOSIS — Z3A38 38 weeks gestation of pregnancy: Secondary | ICD-10-CM

## 2018-08-18 DIAGNOSIS — O24420 Gestational diabetes mellitus in childbirth, diet controlled: Principal | ICD-10-CM | POA: Diagnosis present

## 2018-08-18 DIAGNOSIS — D62 Acute posthemorrhagic anemia: Secondary | ICD-10-CM | POA: Diagnosis not present

## 2018-08-18 DIAGNOSIS — O9081 Anemia of the puerperium: Secondary | ICD-10-CM | POA: Diagnosis not present

## 2018-08-18 DIAGNOSIS — Z3483 Encounter for supervision of other normal pregnancy, third trimester: Secondary | ICD-10-CM | POA: Diagnosis present

## 2018-08-18 LAB — URINE DRUG SCREEN, QUALITATIVE (ARMC ONLY)
Amphetamines, Ur Screen: NOT DETECTED
Barbiturates, Ur Screen: NOT DETECTED
Benzodiazepine, Ur Scrn: NOT DETECTED
Cannabinoid 50 Ng, Ur ~~LOC~~: NOT DETECTED
Cocaine Metabolite,Ur ~~LOC~~: NOT DETECTED
MDMA (Ecstasy)Ur Screen: NOT DETECTED
Methadone Scn, Ur: NOT DETECTED
OPIATE, UR SCREEN: NOT DETECTED
Phencyclidine (PCP) Ur S: NOT DETECTED
Tricyclic, Ur Screen: NOT DETECTED

## 2018-08-18 LAB — PROTEIN / CREATININE RATIO, URINE
Creatinine, Urine: 106 mg/dL
Protein Creatinine Ratio: 0.45 mg/mg{Cre} — ABNORMAL HIGH (ref 0.00–0.15)
Total Protein, Urine: 48 mg/dL

## 2018-08-18 LAB — COMPREHENSIVE METABOLIC PANEL
ALBUMIN: 3.4 g/dL — AB (ref 3.5–5.0)
ALT: 16 U/L (ref 0–44)
AST: 26 U/L (ref 15–41)
Alkaline Phosphatase: 180 U/L — ABNORMAL HIGH (ref 38–126)
Anion gap: 10 (ref 5–15)
BUN: 9 mg/dL (ref 6–20)
CO2: 18 mmol/L — AB (ref 22–32)
Calcium: 9.3 mg/dL (ref 8.9–10.3)
Chloride: 105 mmol/L (ref 98–111)
Creatinine, Ser: 0.73 mg/dL (ref 0.44–1.00)
GFR calc Af Amer: >60 mL/min (ref >60)
GFR calc non Af Amer: >60 mL/min (ref >60)
Glucose, Bld: 114 mg/dL — ABNORMAL HIGH (ref 70–99)
Potassium: 3.8 mmol/L (ref 3.5–5.1)
SODIUM: 133 mmol/L — AB (ref 135–145)
Total Bilirubin: 0.5 mg/dL (ref 0.3–1.2)
Total Protein: 7.5 g/dL (ref 6.5–8.1)

## 2018-08-18 LAB — CBC
HCT: 40.6 % (ref 36.0–46.0)
Hemoglobin: 13.3 g/dL (ref 12.0–15.0)
MCH: 27.4 pg (ref 26.0–34.0)
MCHC: 32.8 g/dL (ref 30.0–36.0)
MCV: 83.5 fL (ref 80.0–100.0)
Platelets: 234 10*3/uL (ref 150–400)
RBC: 4.86 MIL/uL (ref 3.87–5.11)
RDW: 14.6 % (ref 11.5–15.5)
WBC: 16.1 10*3/uL — ABNORMAL HIGH (ref 4.0–10.5)
nRBC: 0 % (ref 0.0–0.2)

## 2018-08-18 LAB — TYPE AND SCREEN
ABO/RH(D): O POS
Antibody Screen: NEGATIVE

## 2018-08-18 MED ORDER — BUPIVACAINE HCL (PF) 0.25 % IJ SOLN
INTRAMUSCULAR | Status: DC | PRN
  Administered 2018-08-18: 5 mL via EPIDURAL

## 2018-08-18 MED ORDER — DIPHENHYDRAMINE HCL 50 MG/ML IJ SOLN: 12.5000 mg | INTRAMUSCULAR | Status: DC | PRN

## 2018-08-18 MED ORDER — TERBUTALINE SULFATE 1 MG/ML IJ SOLN: 0.2500 mg | Freq: Once | INTRAMUSCULAR | Status: DC | PRN

## 2018-08-18 MED ORDER — MISOPROSTOL 200 MCG PO TABS: 800.0000 ug | ORAL_TABLET | Freq: Once | ORAL | Status: DC | PRN

## 2018-08-18 MED ORDER — AMMONIA AROMATIC IN INHA
RESPIRATORY_TRACT | Status: AC
  Filled 2018-08-18: qty 10

## 2018-08-18 MED ORDER — LACTATED RINGERS IV SOLN
INTRAVENOUS | Status: DC
  Administered 2018-08-18 (×3): via INTRAVENOUS

## 2018-08-18 MED ORDER — OXYTOCIN 40 UNITS IN LACTATED RINGERS INFUSION - SIMPLE MED: 2.5000 [IU]/h | INTRAVENOUS | Status: DC

## 2018-08-18 MED ORDER — LIDOCAINE HCL (PF) 1 % IJ SOLN: 30.0000 mL | INTRAMUSCULAR | Status: DC | PRN

## 2018-08-18 MED ORDER — ONDANSETRON HCL 4 MG/2ML IJ SOLN
4.0000 mg | INTRAMUSCULAR | Status: AC
  Administered 2018-08-18: 4 mg via INTRAVENOUS

## 2018-08-18 MED ORDER — OXYTOCIN 40 UNITS IN LACTATED RINGERS INFUSION - SIMPLE MED
INTRAVENOUS | Status: AC
  Administered 2018-08-18: 1 m[IU]/min via INTRAVENOUS
  Filled 2018-08-18: qty 1000

## 2018-08-18 MED ORDER — BUTORPHANOL TARTRATE 1 MG/ML IJ SOLN
1.0000 mg | INTRAMUSCULAR | Status: DC | PRN
  Administered 2018-08-18 (×2): 1 mg via INTRAVENOUS
  Filled 2018-08-18 (×2): qty 1

## 2018-08-18 MED ORDER — FENTANYL 2.5 MCG/ML W/ROPIVACAINE 0.15% IN NS 100 ML EPIDURAL (ARMC)
EPIDURAL | Status: AC
  Filled 2018-08-18: qty 100

## 2018-08-18 MED ORDER — MISOPROSTOL 200 MCG PO TABS
ORAL_TABLET | ORAL | Status: AC
  Filled 2018-08-18: qty 4

## 2018-08-18 MED ORDER — ONDANSETRON 4 MG PO TBDP: 4.0000 mg | ORAL_TABLET | ORAL | Status: DC

## 2018-08-18 MED ORDER — FENTANYL 2.5 MCG/ML W/ROPIVACAINE 0.15% IN NS 100 ML EPIDURAL (ARMC)
12.0000 mL/h | EPIDURAL | Status: DC
  Administered 2018-08-18 (×2): 12 mL/h via EPIDURAL
  Filled 2018-08-18: qty 100

## 2018-08-18 MED ORDER — OXYTOCIN BOLUS FROM INFUSION
500.0000 mL | Freq: Once | INTRAVENOUS | Status: AC
  Administered 2018-08-19: 500 mL via INTRAVENOUS

## 2018-08-18 MED ORDER — LACTATED RINGERS IV SOLN: 500.0000 mL | INTRAVENOUS | Status: DC | PRN

## 2018-08-18 MED ORDER — PHENYLEPHRINE 40 MCG/ML (10ML) SYRINGE FOR IV PUSH (FOR BLOOD PRESSURE SUPPORT)
80.0000 ug | PREFILLED_SYRINGE | INTRAVENOUS | Status: DC | PRN
  Filled 2018-08-18: qty 10

## 2018-08-18 MED ORDER — LACTATED RINGERS IV SOLN: 500.0000 mL | Freq: Once | INTRAVENOUS | Status: DC

## 2018-08-18 MED ORDER — EPHEDRINE 5 MG/ML INJ
10.0000 mg | INTRAVENOUS | Status: DC | PRN
  Filled 2018-08-18: qty 2

## 2018-08-18 MED ORDER — LACTATED RINGERS IV BOLUS
500.0000 mL | Freq: Once | INTRAVENOUS | Status: AC
  Administered 2018-08-18: 500 mL via INTRAVENOUS

## 2018-08-18 MED ORDER — AMMONIA AROMATIC IN INHA: 0.3000 mL | Freq: Once | RESPIRATORY_TRACT | Status: DC | PRN

## 2018-08-18 MED ORDER — ONDANSETRON HCL 4 MG/2ML IJ SOLN
4.0000 mg | Freq: Four times a day (QID) | INTRAMUSCULAR | Status: DC | PRN
  Administered 2018-08-18: 4 mg via INTRAVENOUS
  Filled 2018-08-18 (×2): qty 2

## 2018-08-18 MED ORDER — OXYTOCIN 40 UNITS IN LACTATED RINGERS INFUSION - SIMPLE MED
1.0000 m[IU]/min | INTRAVENOUS | Status: DC
  Administered 2018-08-18: 2 m[IU]/min via INTRAVENOUS
  Administered 2018-08-18: 1 m[IU]/min via INTRAVENOUS

## 2018-08-18 MED ORDER — OXYTOCIN 10 UNIT/ML IJ SOLN
INTRAMUSCULAR | Status: AC
  Filled 2018-08-18: qty 2

## 2018-08-18 MED ORDER — LIDOCAINE HCL (PF) 1 % IJ SOLN
INTRAMUSCULAR | Status: AC
  Filled 2018-08-18: qty 30

## 2018-08-18 NOTE — OB Triage Note (Signed)
CO ctx every 10 minutes with nausea and vomiting. Last vomit was before arrival. Denies LOF. Reports mucous discharge. Reports good fetal movement. [name] [name], [name]

## 2018-08-18 NOTE — Anesthesia Preprocedure Evaluation (Signed)
Anesthesia Evaluation  Patient identified by MRN, date of birth, ID band Patient awake    Reviewed: Allergy & Precautions, NPO status , Patient's Chart, lab work & pertinent test results, reviewed documented beta blocker date and time   Airway Mallampati: II  TM Distance: >3 FB     Dental  (+) Chipped   Pulmonary           Cardiovascular      Neuro/Psych    GI/Hepatic   Endo/Other  diabetes, Type 2  Renal/GU      Musculoskeletal   Abdominal   Peds  Hematology   Anesthesia Other Findings   Reproductive/Obstetrics                             Anesthesia Physical Anesthesia Plan  ASA: II  Anesthesia Plan: Epidural   Post-op Pain Management:    Induction:   PONV Risk Score and Plan:   Airway Management Planned:   Additional Equipment:   Intra-op Plan:   Post-operative Plan:   Informed Consent: I have reviewed the patients History and Physical, chart, labs and discussed the procedure including the risks, benefits and alternatives for the proposed anesthesia with the patient or authorized representative who has indicated his/her understanding and acceptance.     Plan Discussed with: CRNA  Anesthesia Plan Comments:         Anesthesia Quick Evaluation

## 2018-08-18 NOTE — Anesthesia Procedure Notes (Signed)
Epidural Patient location during procedure: OB  Staffing Anesthesiologist: Kamdin Follett, MD Performed: anesthesiologist   Preanesthetic Checklist Completed: patient identified, site marked, surgical consent, pre-op evaluation, timeout performed, IV checked, risks and benefits discussed and monitors and equipment checked  Epidural Patient position: sitting Prep: ChloraPrep Patient monitoring: heart rate, continuous pulse ox and blood pressure Approach: midline Location: L4-L5 Injection technique: LOR saline  Needle:  Needle type: Tuohy  Needle gauge: 18 G Needle length: 9 cm and 9 Catheter type: closed end flexible Catheter size: 20 Guage Test dose: negative and 1.5% lidocaine with Epi 1:200 K  Assessment Sensory level: T10 Events: blood not aspirated, injection not painful, no injection resistance, negative IV test and no paresthesia  Additional Notes   Patient tolerated the insertion well without complications.Reason for block:procedure for pain     

## 2018-08-18 NOTE — Progress Notes (Signed)
Labor Check  Subj:  Complaints: comfortable with epidural   Obj:  BP (!) 136/59   Pulse 88   Temp 98.2 F (36.8 C) (Oral)   Resp 16   Ht 5' (1.524 m)   Wt 78.5 kg   LMP 11/24/2017 (Exact Date)   SpO2 92%   BMI 33.80 kg/m  Dose (milli-units/min) Oxytocin: 6 milli-units/min  Cervix: Dilation: 4 / Effacement (%): 100 / Station: -1   AROM: Clear  IUPC placed without difficulty Baseline FHR: 1450 beats/min   Variability: moderate   Accelerations: present   Decelerations: present Contractions: present frequency: 4-5 q 10 min Overall assessment: cat 2 (had two late decelerations that resolved with maternal position adjustment)  A/P: 22 y.o. G1P0000 female at 539w1d with preeclampsia without severe features (mild preeclampsia).  1.  Labor: AROM - clear. IUPC placed for pitocin titration  2.  FWB: reassuring, Overall assessment: category 1 at the moment (two late decelerations, back-to-back that resolved with maternal position changes)  3.  GBS negative  4.  Pain: epidural 5.  Recheck: 2 hours or prn.   [name] MohairStephen Sharrie Self, MD, Merlinda FrederickFACOG [facility], [facility] 08/18/2018 4:16 PM

## 2018-08-18 NOTE — H&P (Signed)
OB History & Physical   History of Present Illness:  Chief Complaint:  Contractions with nausea and vomiting HPI:  [name] Blanksasheqa M Forrey is a 22 y.o. 591P0000 female with EDC=08/31/2018 at 10474w1d dated by LMP=8wk ultrasound.  Her pregnancy has been complicated by marijuana use, GDMA1 controlled on diet and obesity. Her GDMA1 has been fairly well controlled on diet. EFW on 12/17 was 5#8oz (16.4%)  With HC and FL <2.3%. AFIs have been normal.  .  She presents to L&D for evaluation of worsening contractions accompanied by nausea, vomiting, and diarrhea since being discharged from hospital last night. She was evaluated for labor yesterday and was released when she made no cervical change past 1.5 cm.  She has been vomiting since eating supper last night (chicken quesadilla)  and has also had frequent watery stools. Has had some bloody show since yesterday. No leakage of water.    [facility]ren[facility] also [facility] remarkable for  [facility] Prenatal Labs  Dating Lmp= 8 wk US Blood type: O/Positive/-- (06/18 1110)   Genetic Screen NIPS: normal XX Antibody:Negative (06/18 1110)  Anatomic US WS Rubella: 5.14 (06/18 1110) Varicella:Imm  GTT Third trimester: 185 ABNORMAL 3hr GTT: 83/ 176/ 162/140 RPR: Non Reactive (06/18 1110)   Rhogam na HBsAg: Negative (06/18 1110)   TDaP vaccine    11/14     Flu Shot: declines HIV: Non Reactive (06/18 1110)   Baby Food Breast                               GBS: negative  Contraception  Pap:2018, due PP  CBB     CS/VBAC NA   Support Person Boyfriend Antonio     TWG=38#     Maternal Medical History:   Past Medical History:  Diagnosis Date  . Gestational diabetes     Past Surgical History:  Procedure Laterality Date  . MOUTH SURGERY      Allergies  Allergen Reactions  . Peanut-Containing Drug Products Swelling    Tongue swelling    Prior to Admission medications   Medication Sig Start Date End Date Taking? Authorizing  Provider  glucose blood test strip Use as instructed 07/03/18  Yes Nadara MustardHarris, Robert P, MD  Prenatal Vit-Fe Fumarate-FA (PRENATAL VITAMIN) 27-0.8 MG TABS Take 1 tablet by mouth daily. 06/26/18  Yes Nadara MustardHarris, Robert P, MD     Social History: She  reports that she has never smoked. She has never used smokeless tobacco. She reports previous drug use. She reports that she does not drink alcohol. Initial UDS positive for MJ at NOB appointment.   Family History: family history includes Cervical cancer in her maternal great-grandmother; Diabetes in an other family member; Hypertension in her maternal grandmother.   Review of Systems: Negative x 10 systems reviewed except as noted in the HPI.      Physical Exam:  Vital Signs: Ht 5' (1.524 m)   Wt 78.5 kg   LMP 11/24/2017 (Exact Date)   BMI 33.80 kg/m  BP 141/88 pulse 105 General: gravid BF, unable to lie down, vomited on arrival HEENT: normocephalic, atraumatic Heart: mild tachycardia & regularrhythm.  No murmurs Lungs: clear to auscultation bilaterally Abdomen: soft, gravid, tender with contractions;  EFW: 6#8oz Pelvic:   External: Normal external female genitalia  Cervix: Dilation: 3.5 / Effacement (%): 90 / Station: -1    Extremities: non-tender, symmetric, no edema bilaterally.  DTRs: +  1  Neurologic: Alert & oriented x 3.   Baseline FHR: 150s with accelerations to 170s, moderate variability Toco: contractions every 2-3 minutes?, difficulty picking up with external monitor    Assessment:  [name] [name] is a 22 y.o. G1P0000 female at 771w1d in early labor  Cervix has changed from 1.5 to 3.5 cm Nausea/ vomiting/ diarrhea-probably related to labor FWB: Cat 1 tracing Mild range blood pressure-may be related to anxiety/ pain  R/O gestational hypertension/ preeclampsia GDMA1-has been controlled on diet Plan:  1. Admit to Labor & Delivery   2. CBC, T&S, CMP, UDS, protein/cr ratio 3. GBS negative.   4. CBG every 2 hours if WNL 5. IV  bolus 500 ml, then 125/hr 6. Zofran for nausea/vomiting 7. Stadol for pain, epidural when appropriate 8. Monitor blood pressures, CMP, CBC, protein/creatinine ratio 9. O POS/ RI/ VI 10. Bottle/ contraception? 11. TDAP 07/05/2018  [name] Dabid Godown, CNM  [name] Zoejane Gaulin  08/18/2018 8:04 AM

## 2018-08-19 DIAGNOSIS — O99214 Obesity complicating childbirth: Secondary | ICD-10-CM

## 2018-08-19 DIAGNOSIS — Z3A38 38 weeks gestation of pregnancy: Secondary | ICD-10-CM

## 2018-08-19 DIAGNOSIS — O24420 Gestational diabetes mellitus in childbirth, diet controlled: Secondary | ICD-10-CM

## 2018-08-19 LAB — CBC
HCT: 34.8 % — ABNORMAL LOW (ref 36.0–46.0)
Hemoglobin: 11.3 g/dL — ABNORMAL LOW (ref 12.0–15.0)
MCH: 27.5 pg (ref 26.0–34.0)
MCHC: 32.5 g/dL (ref 30.0–36.0)
MCV: 84.7 fL (ref 80.0–100.0)
Platelets: 200 10*3/uL (ref 150–400)
RBC: 4.11 MIL/uL (ref 3.87–5.11)
RDW: 14.6 % (ref 11.5–15.5)
WBC: 22.6 10*3/uL — ABNORMAL HIGH (ref 4.0–10.5)
nRBC: 0 % (ref 0.0–0.2)

## 2018-08-19 LAB — GLUCOSE, CAPILLARY
GLUCOSE-CAPILLARY: 70 mg/dL (ref 70–99)
Glucose-Capillary: 112 mg/dL — ABNORMAL HIGH (ref 70–99)
Glucose-Capillary: 73 mg/dL (ref 70–99)
Glucose-Capillary: 106 mg/dL — ABNORMAL HIGH (ref 70–99)
Glucose-Capillary: 96 mg/dL (ref 70–99)

## 2018-08-19 MED ORDER — ACETAMINOPHEN 325 MG PO TABS
ORAL_TABLET | ORAL | Status: AC
  Filled 2018-08-19: qty 2

## 2018-08-19 MED ORDER — SIMETHICONE 80 MG PO CHEW: 80.0000 mg | CHEWABLE_TABLET | ORAL | Status: DC | PRN

## 2018-08-19 MED ORDER — SENNOSIDES-DOCUSATE SODIUM 8.6-50 MG PO TABS
2.0000 | ORAL_TABLET | ORAL | Status: DC
  Administered 2018-08-20: 2 via ORAL
  Filled 2018-08-19 (×2): qty 2

## 2018-08-19 MED ORDER — DIBUCAINE 1 % RE OINT: 1.0000 "application " | TOPICAL_OINTMENT | RECTAL | Status: DC | PRN

## 2018-08-19 MED ORDER — OXYTOCIN 10 UNIT/ML IJ SOLN
INTRAMUSCULAR | Status: AC
  Filled 2018-08-19: qty 2

## 2018-08-19 MED ORDER — HYDROCODONE-ACETAMINOPHEN 5-325 MG PO TABS: 1.0000 | ORAL_TABLET | Freq: Four times a day (QID) | ORAL | Status: DC | PRN

## 2018-08-19 MED ORDER — FERROUS SULFATE 325 (65 FE) MG PO TABS
325.0000 mg | ORAL_TABLET | Freq: Two times a day (BID) | ORAL | Status: DC
  Administered 2018-08-19 – 2018-08-20 (×3): 325 mg via ORAL
  Filled 2018-08-19 (×3): qty 1

## 2018-08-19 MED ORDER — WITCH HAZEL-GLYCERIN EX PADS: 1.0000 "application " | MEDICATED_PAD | CUTANEOUS | Status: DC | PRN

## 2018-08-19 MED ORDER — MISOPROSTOL 200 MCG PO TABS
ORAL_TABLET | ORAL | Status: AC
  Filled 2018-08-19: qty 4

## 2018-08-19 MED ORDER — LIDOCAINE HCL (PF) 1 % IJ SOLN
INTRAMUSCULAR | Status: AC
  Filled 2018-08-19: qty 30

## 2018-08-19 MED ORDER — ACETAMINOPHEN 325 MG PO TABS
650.0000 mg | ORAL_TABLET | ORAL | Status: DC | PRN
  Administered 2018-08-19 (×2): 650 mg via ORAL
  Filled 2018-08-19: qty 2

## 2018-08-19 MED ORDER — AMMONIA AROMATIC IN INHA
RESPIRATORY_TRACT | Status: AC
  Filled 2018-08-19: qty 10

## 2018-08-19 MED ORDER — IBUPROFEN 600 MG PO TABS
600.0000 mg | ORAL_TABLET | Freq: Four times a day (QID) | ORAL | Status: DC
  Administered 2018-08-19: 600 mg via ORAL
  Filled 2018-08-19: qty 1

## 2018-08-19 MED ORDER — ONDANSETRON HCL 4 MG/2ML IJ SOLN: 4.0000 mg | INTRAMUSCULAR | Status: DC | PRN

## 2018-08-19 MED ORDER — ONDANSETRON HCL 4 MG PO TABS: 4.0000 mg | ORAL_TABLET | ORAL | Status: DC | PRN

## 2018-08-19 MED ORDER — DIPHENHYDRAMINE HCL 25 MG PO CAPS: 25.0000 mg | ORAL_CAPSULE | Freq: Four times a day (QID) | ORAL | Status: DC | PRN

## 2018-08-19 MED ORDER — COCONUT OIL OIL: 1.0000 "application " | TOPICAL_OIL | Status: DC | PRN

## 2018-08-19 MED ORDER — BENZOCAINE-MENTHOL 20-0.5 % EX AERO
1.0000 "application " | INHALATION_SPRAY | CUTANEOUS | Status: DC | PRN
  Administered 2018-08-19: 1 via TOPICAL

## 2018-08-19 MED ORDER — IBUPROFEN 600 MG PO TABS
600.0000 mg | ORAL_TABLET | Freq: Four times a day (QID) | ORAL | Status: DC
  Administered 2018-08-19 – 2018-08-20 (×5): 600 mg via ORAL
  Filled 2018-08-19 (×5): qty 1

## 2018-08-19 MED ORDER — PRENATAL MULTIVITAMIN CH
1.0000 | ORAL_TABLET | Freq: Every day | ORAL | Status: DC
  Administered 2018-08-20: 1 via ORAL
  Filled 2018-08-19: qty 1

## 2018-08-19 MED ORDER — BENZOCAINE-MENTHOL 20-0.5 % EX AERO
INHALATION_SPRAY | CUTANEOUS | Status: AC
  Filled 2018-08-19: qty 56

## 2018-08-19 NOTE — Discharge Summary (Signed)
OB Discharge Summary     Patient Name: [name] [name] DOB: 01/05/1996 MRN: [id number]  Date of admission: 08/18/2018 Delivering MD: Thomasene MohairStephen Jackson, MD  Date of Delivery: 08/19/2018  Date of discharge: 08/20/2018  Admitting diagnosis: Normal labor Intrauterine pregnancy: 6758w2d     Secondary diagnosis: Gestational Diabetes diet controlled (A1)     Discharge diagnosis: Term Pregnancy Delivered and GDM A1 , mild preeclampsia                                                                                               Post partum procedures:none  Augmentation: AROM and Pitocin  Complications: None  Hospital course:  Onset of Labor With Vaginal Delivery     22 y.o. yo G1P0000 at 4958w2d was admitted in Active Labor on 08/18/2018. Patient had an uncomplicated labor course as follows:  Membrane Rupture Time/Date: 4:10 PM ,08/18/2018   Intrapartum Procedures: Episiotomy: None [1]                                         Lacerations:  2nd degree [3];Vaginal [6]  Patient had a delivery of a Viable infant. 08/19/2018  Information for the patient's newborn:  Robbi GarterLee, Girl Donnie [[id number]][[id number]]  Delivery Method: Vag-Spont    Pateint had an uncomplicated postpartum course.  She is ambulating, tolerating a regular diet, and urinating well. Her blood pressures postpartum have been normal. Her CBGs postpartum have been 70-112.  Patient is discharged home in stable condition on 08/20/18.   Physical exam  Vitals:   08/19/18 1208 08/19/18 1620 08/19/18 2300 08/20/18 0835  BP: 128/77 111/62 126/62 127/65  Pulse: 100 92 93 89  Resp: 18 20 18 20   Temp: 97.6 F (36.4 C) 97.9 F (36.6 C) 98 F (36.7 C) 97.7 F (36.5 C)  TempSrc: Oral Oral Oral Oral  SpO2: 99% 99% 98% 99%  Weight:      Height:       General: alert, cooperative and no distress , bottle feeding her baby Lochia: appropriate Uterine Fundus: firm at U/ slightly right of ML/ NT Incision: Healing well with no significant drainage DVT  Evaluation: No evidence of DVT seen on physical exam.  Labs: Lab Results  Component Value Date   WBC 22.6 (H) 08/19/2018   HGB 11.3 (L) 08/19/2018   HCT 34.8 (L) 08/19/2018   MCV 84.7 08/19/2018   PLT 200 08/19/2018   Results for orders placed or performed during the hospital encounter of 08/18/18 (from the past 48 hour(s))  Glucose, capillary     Status: Abnormal   Collection Time: 08/19/18  5:51 AM  Result Value Ref Range   Glucose-Capillary 112 (H) 70 - 99 mg/dL  CBC     Status: Abnormal   Collection Time: 08/19/18  5:52 AM  Result Value Ref Range   WBC 22.6 (H) 4.0 - 10.5 K/uL   RBC 4.11 3.87 - 5.11 MIL/uL   Hemoglobin 11.3 (L) 12.0 - 15.0 g/dL   HCT 78.234.8 (L) 95.636.0 - 21.346.0 %  MCV 84.7 80.0 - 100.0 fL   MCH 27.5 26.0 - 34.0 pg   MCHC 32.5 30.0 - 36.0 g/dL   RDW 21.314.6 08.611.5 - 57.815.5 %   Platelets 200 150 - 400 K/uL   nRBC 0.0 0.0 - 0.2 %    Comment: Performed at [facility], [address] [place], [place] [address]  Glucose, capillary     Status: None   Collection Time: 08/19/18  8:59 AM  Result Value Ref Range   Glucose-Capillary 73 70 - 99 mg/dL  Glucose, capillary     Status: Abnormal   Collection Time: 08/19/18 12:21 PM  Result Value Ref Range   Glucose-Capillary 106 (H) 70 - 99 mg/dL  Glucose, capillary     Status: None   Collection Time: 08/19/18  4:05 PM  Result Value Ref Range   Glucose-Capillary 70 70 - 99 mg/dL  Glucose, capillary     Status: None   Collection Time: 08/19/18  9:15 PM  Result Value Ref Range   Glucose-Capillary 96 70 - 99 mg/dL  Glucose, capillary     Status: None   Collection Time: 08/20/18  5:06 AM  Result Value Ref Range   Glucose-Capillary 72 70 - 99 mg/dL  Glucose, capillary     Status: Abnormal   Collection Time: 08/20/18 10:26 AM  Result Value Ref Range   Glucose-Capillary 103 (H) 70 - 99 mg/dL   Comment 1 Notify RN    Discharge instruction: per After Visit Summary.  Medications:  Allergies as of 08/20/2018       Reactions   Peanut-containing Drug Products Swelling   Tongue swelling      Medication List    STOP taking these medications   glucose blood test strip     TAKE these medications   ibuprofen 600 MG tablet Commonly known as:  ADVIL,MOTRIN Take 1 tablet (600 mg total) by mouth every 6 (six) hours.   Prenatal Vitamin 27-0.8 MG Tabs Take 1 tablet by mouth daily.       Diet: routine diet  Activity: Advance as tolerated. Pelvic rest for 6 weeks.   Outpatient follow up: Follow-up Information    Conard NovakJackson, Stephen D, MD. Schedule an appointment as soon as possible for a visit in 6 week(s).   Specialty:  Obstetrics and Gynecology Why:  Routine postpartum and 2 hour glucose tolerance test Contact information: [address] [place] [address] [phone]        Farrel ConnersGutierrez, Miko Sirico, CNM. Schedule an appointment as soon as possible for a visit.   Specialty:  Certified Nurse Midwife Why:  for blood pressure check in 3-4 days Contact information: [address] [place] [address] [phone]             Postpartum contraception: Undecided Rhogam Given postpartum: no Rubella vaccine given postpartum: no Varicella vaccine given postpartum: no TDaP given antepartum or postpartum: given 07/05/18 Flu Vaccine: declined  Newborn Data: Live born female [place] Birth Weight:  6#4.2oz APGAR: 8, 9  Newborn Delivery   Birth date/time:  08/19/2018 00:32:00 Delivery type:  Vaginal, Spontaneous      Baby Feeding: Bottle  Disposition:home with mother  SIGNED: [name] Aj [place], CNM

## 2018-08-19 NOTE — Anesthesia Postprocedure Evaluation (Signed)
Anesthesia Post Note  Patient: [name] [name]  Procedure(s) Performed: AN AD HOC LABOR EPIDURAL  Patient location during evaluation: Mother Baby Anesthesia Type: Epidural Level of consciousness: awake and alert Pain management: pain level controlled Vital Signs Assessment: post-procedure vital signs reviewed and stable Respiratory status: spontaneous breathing, nonlabored ventilation and respiratory function stable Cardiovascular status: stable Postop Assessment: no headache, no backache and epidural receding Anesthetic complications: no     Last Vitals:  Vitals:   08/19/18 0344 08/19/18 0445  BP: (!) 135/55 (!) 132/59  Pulse: (!) 110 (!) 115  Resp: 18 18  Temp: 37.2 C 37.1 C  SpO2: 98% 98%    Last Pain:  Vitals:   08/19/18 0445  TempSrc: Oral  PainSc:                  Crisanto Nied S

## 2018-08-19 NOTE — Progress Notes (Signed)
PPD#0 SVD Subjective:  Ambulating in room.  Pain control is good. Voiding without difficulty. Tolerating a regular diet.   Objective:   Blood pressure 113/62, pulse 93, temperature 97.7 F (36.5 C), temperature source Oral, resp. rate 20, height 5' (1.524 m), weight 78.5 kg, last menstrual period 11/24/2017, SpO2 100 %.  General: NAD Pulmonary: no increased work of breathing Abdomen: non-distended, non-tender Uterus:  fundus firm; lochia appropriate Extremities: no edema, no erythema, no tenderness, no signs of DVT  Results for orders placed or performed during the hospital encounter of 08/18/18 (from the past 72 hour(s))  CBC     Status: Abnormal   Collection Time: 08/18/18  7:51 AM  Result Value Ref Range   WBC 16.1 (H) 4.0 - 10.5 K/uL   RBC 4.86 3.87 - 5.11 MIL/uL   Hemoglobin 13.3 12.0 - 15.0 g/dL   HCT 16.140.6 09.636.0 - 04.546.0 %   MCV 83.5 80.0 - 100.0 fL   MCH 27.4 26.0 - 34.0 pg   MCHC 32.8 30.0 - 36.0 g/dL   RDW 40.914.6 81.111.5 - 91.415.5 %   Platelets 234 150 - 400 K/uL   nRBC 0.0 0.0 - 0.2 %    Comment: Performed at [facility], [address] [place], [place] [address]  Type and screen [facility]     Status: None   Collection Time: 08/18/18  7:51 AM  Result Value Ref Range   ABO/RH(D) O POS    Antibody Screen NEG    Sample Expiration      08/21/2018 Performed at [facility], [address] [place], [place] [address]   Comprehensive metabolic panel     Status: Abnormal   Collection Time: 08/18/18  7:51 AM  Result Value Ref Range   Sodium 133 (L) 135 - 145 mmol/L   Potassium 3.8 3.5 - 5.1 mmol/L   Chloride 105 98 - 111 mmol/L   CO2 18 (L) 22 - 32 mmol/L   Glucose, Bld 114 (H) 70 - 99 mg/dL   BUN 9 6 - 20 mg/dL   Creatinine, Ser 8.650.73 0.44 - 1.00 mg/dL   Calcium 9.3 8.9 - 78.410.3 mg/dL   Total Protein 7.5 6.5 - 8.1 g/dL   Albumin 3.4 (L) 3.5 - 5.0 g/dL   AST 26 15 - 41 U/L   ALT 16 0 - 44 U/L   Alkaline Phosphatase 180 (H) 38 - 126  U/L   Total Bilirubin 0.5 0.3 - 1.2 mg/dL   GFR calc non Af Amer >60 >60 mL/min   GFR calc Af Amer >60 >60 mL/min   Anion gap 10 5 - 15    Comment: Performed at [facility], [address] [place], [place] [address]  Urine Drug Screen, Qualitative ([facility] only)     Status: None   Collection Time: 08/18/18 10:44 AM  Result Value Ref Range   Tricyclic, Ur Screen NONE DETECTED NONE DETECTED   Amphetamines, Ur Screen NONE DETECTED NONE DETECTED   MDMA (Ecstasy)Ur Screen NONE DETECTED NONE DETECTED   Cocaine Metabolite,Ur [place] NONE DETECTED NONE DETECTED   Opiate, Ur Screen NONE DETECTED NONE DETECTED   Phencyclidine (PCP) Ur S NONE DETECTED NONE DETECTED   Cannabinoid 50 Ng, Ur [place] NONE DETECTED NONE DETECTED   Barbiturates, Ur Screen NONE DETECTED NONE DETECTED   Benzodiazepine, Ur Scrn NONE DETECTED NONE DETECTED   Methadone Scn, Ur NONE DETECTED NONE DETECTED    Comment: (NOTE) Tricyclics + metabolites, urine  Cutoff 1000 ng/mL Amphetamines + metabolites, urine  Cutoff 1000 ng/mL MDMA (Ecstasy), urine              Cutoff 500 ng/mL Cocaine Metabolite, urine          Cutoff 300 ng/mL Opiate + metabolites, urine        Cutoff 300 ng/mL Phencyclidine (PCP), urine         Cutoff 25 ng/mL Cannabinoid, urine                 Cutoff 50 ng/mL Barbiturates + metabolites, urine  Cutoff 200 ng/mL Benzodiazepine, urine              Cutoff 200 ng/mL Methadone, urine                   Cutoff 300 ng/mL The urine drug screen provides only a preliminary, unconfirmed analytical test result and should not be used for non-medical purposes. Clinical consideration and professional judgment should be applied to any positive drug screen result due to possible interfering substances. A more specific alternate chemical method must be used in order to obtain a confirmed analytical result. Gas chromatography / mass spectrometry (GC/MS) is the preferred confirmat ory method. Performed at [facility], [address] [place], [place] [address]   Protein / creatinine ratio, urine     Status: Abnormal   Collection Time: 08/18/18 10:44 AM  Result Value Ref Range   Creatinine, Urine 106 mg/dL   Total Protein, Urine 48 mg/dL    Comment: NO NORMAL RANGE ESTABLISHED FOR THIS TEST   Protein Creatinine Ratio 0.45 (H) 0.00 - 0.15 mg/mg[Cre]    Comment: Performed at [facility], [address] [place], [place] [address]  Glucose, capillary     Status: Abnormal   Collection Time: 08/19/18  5:51 AM  Result Value Ref Range   Glucose-Capillary 112 (H) 70 - 99 mg/dL  CBC     Status: Abnormal   Collection Time: 08/19/18  5:52 AM  Result Value Ref Range   WBC 22.6 (H) 4.0 - 10.5 K/uL   RBC 4.11 3.87 - 5.11 MIL/uL   Hemoglobin 11.3 (L) 12.0 - 15.0 g/dL   HCT 62.134.8 (L) 30.836.0 - 65.746.0 %   MCV 84.7 80.0 - 100.0 fL   MCH 27.5 26.0 - 34.0 pg   MCHC 32.5 30.0 - 36.0 g/dL   RDW 84.614.6 96.211.5 - 95.215.5 %   Platelets 200 150 - 400 K/uL   nRBC 0.0 0.0 - 0.2 %    Comment: Performed at [facility], [address] [place], [place] [address]  Glucose, capillary     Status: None   Collection Time: 08/19/18  8:59 AM  Result Value Ref Range   Glucose-Capillary 73 70 - 99 mg/dL    Assessment:   22 y.o. G1P1001 postpartum day # 0 in good condition,  Plan:   1) Acute blood loss anemia - hemodynamically stable and asymptomatic - Continue prenatal vitamins  2) Blood Type --/--/O POS (12/28 0751) /   3) Rubella 5.14 (06/18 1110) / Varicella Immune / TDAP status: received antepartum 07/05/2018  4) Formula feeding  5) Contraception: not discussed  6) Disposition: continue postpartum care.  Marcelyn BruinsJacelyn Schmid, CNM 08/19/2018

## 2018-08-20 LAB — GLUCOSE, CAPILLARY
Glucose-Capillary: 72 mg/dL (ref 70–99)
Glucose-Capillary: 103 mg/dL — ABNORMAL HIGH (ref 70–99)

## 2018-08-20 LAB — RPR: RPR Ser Ql: NONREACTIVE

## 2018-08-20 MED ORDER — IBUPROFEN 600 MG PO TABS: 600.0000 mg | ORAL_TABLET | Freq: Four times a day (QID) | ORAL | 0 refills | Status: DC

## 2018-08-20 MED ORDER — PRENATAL VITAMIN 27-0.8 MG PO TABS: 1.0000 | ORAL_TABLET | Freq: Every day | ORAL | 3 refills | Status: DC

## 2018-08-27 ENCOUNTER — Encounter: Payer: Medicaid Other | Admitting: Maternal Newborn

## 2018-08-27 ENCOUNTER — Ambulatory Visit: Payer: Medicaid Other

## 2018-10-04 ENCOUNTER — Ambulatory Visit (INDEPENDENT_AMBULATORY_CARE_PROVIDER_SITE_OTHER): Payer: Medicaid Other | Admitting: Obstetrics and Gynecology

## 2018-10-04 ENCOUNTER — Encounter: Payer: Self-pay | Admitting: Obstetrics and Gynecology

## 2018-10-04 DIAGNOSIS — O24439 Gestational diabetes mellitus in the puerperium, unspecified control: Secondary | ICD-10-CM

## 2018-10-04 NOTE — Progress Notes (Signed)
Postpartum Visit  Chief Complaint:  Chief Complaint  Patient presents with  . Postpartum Care    History of Present Illness: Patient is a 23 y.o. G1P1001 presents for postpartum visit.  Date of delivery: 08/19/2018 Type of delivery: Vaginal delivery - Vacuum or forceps assisted  no Episiotomy No.  Laceration: 2nd degree Pregnancy or labor problems:  GDMA1, mild preeclampsia Any problems since the delivery:  no  Newborn Details:  SINGLETON :  1. Baby's name: Ta'layah. Birth weight: 6.4 Maternal Details:  Breast Feeding:  Bottle Post partum depression/anxiety noted:  no Edinburgh Post-Partum Depression Score:  0  Date of last PAP: pt not sure on last pap, thinks it was at [facility].  Past Medical History:  Diagnosis Date  . Gestational diabetes     Past Surgical History:  Procedure Laterality Date  . MOUTH SURGERY      Prior to Admission medications   Medication Sig Start Date End Date Taking? Authorizing Provider  Prenatal Vit-Fe Fumarate-FA (PRENATAL VITAMIN) 27-0.8 MG TABS Take 1 tablet by mouth daily. 08/20/18  Yes Farrel Conners, CNM  ibuprofen (ADVIL,MOTRIN) 600 MG tablet Take 1 tablet (600 mg total) by mouth every 6 (six) hours. Patient not taking: Reported on 10/04/2018 08/20/18   Farrel Conners, CNM    Allergies  Allergen Reactions  . Peanut-Containing Drug Products Swelling    Tongue swelling     Social History   Socioeconomic History  . Marital status: Single    Spouse name: Not on file  . Number of children: Not on file  . Years of education: Not on file  . Highest education level: Not on file  Occupational History  . Not on file  Social Needs  . Financial resource strain: Patient refused  . Food insecurity:    Worry: Never true    Inability: Never true  . Transportation needs:    Medical: No    Non-medical: No  Tobacco Use  . Smoking status: Never Smoker  . Smokeless tobacco: Never Used  Substance and Sexual Activity  . Alcohol  use: Never    Frequency: Never  . Drug use: Not Currently    Comment: UDS positive for MJ at [facility]  . Sexual activity: Yes    Birth control/protection: None  Lifestyle  . Physical activity:    Days per week: 0 days    Minutes per session: 0 min  . Stress: Only a little  Relationships  . Social connections:    Talks on phone: More than three times a week    Gets together: More than three times a week    Attends religious service: Never    Active member of club or organization: No    Attends meetings of clubs or organizations: Never    Relationship status: Never married  . Intimate partner violence:    Fear of current or ex partner: No    Emotionally abused: No    Physically abused: No    Forced sexual activity: No  Other Topics Concern  . Not on file  Social History Narrative  . Not on file    Family History  Problem Relation Age of Onset  . Hypertension Maternal Grandmother   . Diabetes Other        maternal great aunts and uncles  . Cervical cancer Maternal Great-grandmother     Review of Systems  Constitutional: Negative.   HENT: Negative.   Eyes: Negative.   Respiratory: Negative.   Cardiovascular: Negative.   Gastrointestinal: Negative.  Genitourinary: Negative.   Musculoskeletal: Negative.   Skin: Negative.   Neurological: Negative.   Psychiatric/Behavioral: Negative.      Physical Exam BP 112/70   Ht 5' (1.524 m)   Wt 148 lb (67.1 kg)   LMP 09/26/2018   BMI 28.90 kg/m   Physical Exam Constitutional:      General: She is not in acute distress.    Appearance: Normal appearance. She is well-developed.  Genitourinary:     Pelvic exam was performed with patient in the lithotomy position.     Vulva, inguinal canal, urethra and bladder normal.  HENT:     Head: Normocephalic and atraumatic.  Eyes:     General: No scleral icterus.    Conjunctiva/sclera: Conjunctivae normal.  Neck:     Musculoskeletal: Normal range of motion and neck supple.    Cardiovascular:     Rate and Rhythm: Normal rate and regular rhythm.     Heart sounds: No murmur. No friction rub. No gallop.   Pulmonary:     Effort: Pulmonary effort is normal. No respiratory distress.     Breath sounds: Normal breath sounds. No wheezing or rales.  Abdominal:     General: Bowel sounds are normal. There is no distension.     Palpations: Abdomen is soft. There is no mass.     Tenderness: There is no abdominal tenderness. There is no guarding or rebound.  Musculoskeletal: Normal range of motion.  Neurological:     General: No focal deficit present.     Mental Status: She is alert and oriented to person, place, and time.     Cranial Nerves: No cranial nerve deficit.  Skin:    General: Skin is warm and dry.     Findings: No erythema.  Psychiatric:        Mood and Affect: Mood normal.        Behavior: Behavior normal.        Judgment: Judgment normal.   patient declined internal exam and pap smear today   Female Chaperone present during breast and/or pelvic exam.  Assessment: 23 y.o. G1P1001 presenting for 6 week postpartum visit  Plan: Problem List Items Addressed This Visit    None    Visit Diagnoses    Postpartum care and examination    -  Primary   Relevant Orders   Glucose Tolerance, 2 Hours w/1 Hour   Gestational diabetes mellitus (GDM), postpartum       Relevant Orders   Glucose Tolerance, 2 Hours w/1 Hour       1) Contraception Education given regarding options for contraception, including barrier methods. Discussed high risk of pregnancy with this contraceptive technique.  Recommend waiting at least 18 months prior to next pregnancy.Marland Kitchen  2)  Pap - ASCCP guidelines and rational discussed.  Patient opts for routine screening interval  3) Patient underwent screening for postpartum depression with no concerns noted.  4) GDMA1: get 2 hour gtt when possible (soon)  5) Follow up 1 year for routine annual exam  Thomasene Mohair, MD 10/04/2018 10:15  AM

## 2018-10-11 ENCOUNTER — Other Ambulatory Visit: Payer: Medicaid Other

## 2018-10-25 ENCOUNTER — Other Ambulatory Visit: Payer: Medicaid Other

## 2018-11-01 ENCOUNTER — Other Ambulatory Visit: Payer: Self-pay

## 2018-11-01 ENCOUNTER — Other Ambulatory Visit: Payer: Medicaid Other

## 2018-11-01 DIAGNOSIS — O24439 Gestational diabetes mellitus in the puerperium, unspecified control: Secondary | ICD-10-CM

## 2018-11-02 LAB — GLUCOSE TOLERANCE, 2 HOURS W/ 1HR
Glucose, 1 hour: 87 mg/dL (ref 65–179)
Glucose, 2 hour: 90 mg/dL (ref 65–152)
Glucose, Fasting: 77 mg/dL (ref 65–91)

## 2019-06-12 ENCOUNTER — Encounter: Payer: Self-pay | Admitting: Obstetrics and Gynecology

## 2019-06-12 ENCOUNTER — Other Ambulatory Visit: Payer: Self-pay

## 2019-06-12 ENCOUNTER — Other Ambulatory Visit (HOSPITAL_COMMUNITY)
Admission: RE | Admit: 2019-06-12 | Discharge: 2019-06-12 | Disposition: A | Discharge status: Home / Self Care | Payer: Medicaid Other | Source: Ambulatory Visit | Attending: Obstetrics and Gynecology | Admitting: Obstetrics and Gynecology

## 2019-06-12 ENCOUNTER — Ambulatory Visit (INDEPENDENT_AMBULATORY_CARE_PROVIDER_SITE_OTHER): Payer: Medicaid Other | Admitting: Obstetrics and Gynecology

## 2019-06-12 VITALS — BP 118/74 | Ht 60.0 in | Wt 155.0 lb

## 2019-06-12 DIAGNOSIS — Z01419 Encounter for gynecological examination (general) (routine) without abnormal findings: Secondary | ICD-10-CM

## 2019-06-12 DIAGNOSIS — Z124 Encounter for screening for malignant neoplasm of cervix: Secondary | ICD-10-CM | POA: Diagnosis present

## 2019-06-12 DIAGNOSIS — Z0000 Encounter for general adult medical examination without abnormal findings: Secondary | ICD-10-CM

## 2019-06-12 DIAGNOSIS — R1031 Right lower quadrant pain: Secondary | ICD-10-CM

## 2019-06-12 DIAGNOSIS — Z113 Encounter for screening for infections with a predominantly sexual mode of transmission: Secondary | ICD-10-CM | POA: Insufficient documentation

## 2019-06-12 DIAGNOSIS — Z1331 Encounter for screening for depression: Secondary | ICD-10-CM

## 2019-06-12 DIAGNOSIS — N920 Excessive and frequent menstruation with regular cycle: Secondary | ICD-10-CM

## 2019-06-12 DIAGNOSIS — Z1339 Encounter for screening examination for other mental health and behavioral disorders: Secondary | ICD-10-CM

## 2019-06-12 NOTE — Progress Notes (Addendum)
Gynecology Annual Exam  PCP: [facility], [place]  Chief Complaint  Patient presents with  . Pelvic Pain  . Menstrual Problem  Annual Exam  History of Present Illness:  [name] is a 23 y.o. G1P1001 who LMP was Patient's last menstrual period was 06/09/2019., presents today for her annual examination.  Her menses are regular every 28-30 days, lasting 9 day(s).  Dysmenorrhea severe, occurring first 1-2 days of flow. She does not have intermenstrual bleeding.  She has been having pain her lower abdomen for the last 3 days.  Her pain is located on her right lower quadrant. She describes the pain as sharp. The pain comes and goes.  The pain was on her lower abdomen across the middle and has localized to her right side.  Alleviating factors: ibuprofen didn't heating pad. Soaking in warm warm.  Aggravating factors: none. Associated symptoms: none. Denies nausea, she did have one episode of emesis. She denies fevers, chills, diarrhea, and constipation.  Denies hematochezia.  Denies urinary symptoms.  Denies hematuria.  Denies anorexia.  Since her delivery her periods have been gradually been getting worse. She is using nothing for contraception. She used nothing for contraception prior to pregnancy.   She is sexually active.  No pain with intercourse.    Last Pap: unsure Hx of STDs: chlamydia - age 38.   There is no FH of breast cancer. There is no FH of ovarian cancer. The patient does do self-breast exams.  Tobacco use: The patient denies current or previous tobacco use. Alcohol use: none Exercise: not active  The patient wears seatbelts: yes.   The patient reports that domestic violence in her life is absent.   Past Medical History:  Diagnosis Date  . Gestational diabetes     Past Surgical History:  Procedure Laterality Date  . MOUTH SURGERY      Prior to Admission medications: Denies    Allergies  Allergen Reactions  . Peanut-Containing Drug Products Swelling   Tongue swelling   Obstetric History: G1P1001  Social History   Socioeconomic History  . Marital status: Single    Spouse name: Not on file  . Number of children: Not on file  . Years of education: Not on file  . Highest education level: Not on file  Occupational History  . Not on file  Social Needs  . Financial resource strain: Patient refused  . Food insecurity    Worry: Never true    Inability: Never true  . Transportation needs    Medical: No    Non-medical: No  Tobacco Use  . Smoking status: Never Smoker  . Smokeless tobacco: Never Used  Substance and Sexual Activity  . Alcohol use: Never    Frequency: Never  . Drug use: Not Currently    Comment: UDS positive for MJ at [facility]  . Sexual activity: Yes    Birth control/protection: None  Lifestyle  . Physical activity    Days per week: 0 days    Minutes per session: 0 min  . Stress: Only a little  Relationships  . Social connections    Talks on phone: More than three times a week    Gets together: More than three times a week    Attends religious service: Never    Active member of club or organization: No    Attends meetings of clubs or organizations: Never    Relationship status: Never married  . Intimate partner violence    Fear of  current or ex partner: No    Emotionally abused: No    Physically abused: No    Forced sexual activity: No  Other Topics Concern  . Not on file  Social History Narrative  . Not on file    Family History  Problem Relation Age of Onset  . Hypertension Maternal Grandmother   . Diabetes Other        maternal great aunts and uncles  . Cervical cancer Maternal Great-grandmother     Review of Systems  Constitutional: Negative.   HENT: Negative.   Eyes: Negative.   Respiratory: Negative.   Cardiovascular: Negative.   Gastrointestinal: Positive for abdominal pain. Negative for blood in stool, constipation, diarrhea, heartburn, melena, nausea and vomiting.  Genitourinary:  Negative.  Negative for frequency and hematuria.  Musculoskeletal: Negative.   Skin: Negative.   Neurological: Negative.   Psychiatric/Behavioral: Negative.      Physical Exam BP 118/74   Ht 5' (1.524 m)   Wt 155 lb (70.3 kg)   LMP 06/09/2019   BMI 30.27 kg/m    Physical Exam Constitutional:      General: She is not in acute distress.    Appearance: Normal appearance. She is well-developed.  Genitourinary:     Pelvic exam was performed with patient in the lithotomy position.     Vulva, urethra, bladder, vagina and uterus normal.     No inguinal adenopathy present in the right or left side.    No signs of injury in the vagina.     No vaginal discharge, erythema, tenderness or bleeding.     No cervical motion tenderness, discharge, lesion or polyp.     Uterus is mobile.     Uterus is not enlarged or tender.     No uterine mass detected.    Uterus is anteverted.     No right or left adnexal mass present.     Right adnexa not tender or full.     Left adnexa not tender or full.  HENT:     Head: Normocephalic and atraumatic.  Eyes:     General: No scleral icterus.    Conjunctiva/sclera: Conjunctivae normal.  Neck:     Musculoskeletal: Normal range of motion and neck supple.     Thyroid: No thyromegaly.  Cardiovascular:     Rate and Rhythm: Normal rate and regular rhythm.     Heart sounds: No murmur. No friction rub. No gallop.   Pulmonary:     Effort: Pulmonary effort is normal. No respiratory distress.     Breath sounds: Normal breath sounds. No wheezing or rales.  Chest:     Breasts:        Right: No inverted nipple, mass, nipple discharge, skin change or tenderness.        Left: No inverted nipple, mass, nipple discharge, skin change or tenderness.  Abdominal:     General: Bowel sounds are normal. There is no distension.     Palpations: Abdomen is soft. There is no mass.     Tenderness: There is abdominal tenderness (mild RLQ). There is no right CVA tenderness, left  CVA tenderness, guarding or rebound.     Hernia: No hernia is present.  Musculoskeletal: Normal range of motion.        General: No swelling or tenderness.  Lymphadenopathy:     Cervical: No cervical adenopathy.     Lower Body: No right inguinal adenopathy. No left inguinal adenopathy.  Neurological:     General: No  focal deficit present.     Mental Status: She is alert and oriented to person, place, and time.     Cranial Nerves: No cranial nerve deficit.  Skin:    General: Skin is warm and dry.     Findings: No erythema or rash.  Psychiatric:        Mood and Affect: Mood normal.        Behavior: Behavior normal.        Judgment: Judgment normal.  Vitals signs reviewed.     Female chaperone present for pelvic and breast  portions of the physical exam  Assessment: 23 y.o. G73P1001 female here for routine annual gynecologic examination  Plan: Problem List Items Addressed This Visit    None    Visit Diagnoses    Women's annual routine gynecological examination    -  Primary   Relevant Orders   US PELVIS TRANSVAGINAL NON-OB (TV ONLY)   Cytology - PAP   Screening for depression       Screening for alcoholism       Pap smear for cervical cancer screening       Relevant Orders   Cytology - PAP   Screen for STD (sexually transmitted disease)       Relevant Orders   Cytology - PAP   Right lower quadrant abdominal pain       Relevant Orders   US PELVIS TRANSVAGINAL NON-OB (TV ONLY)   Menorrhagia with regular cycle       Relevant Orders   US PELVIS TRANSVAGINAL NON-OB (TV ONLY)     Screening: -- Blood pressure screen normal -- Weight screening: overweight: continue to monitor -- Depression screening negative (PHQ-9) -- Nutrition: normal -- cholesterol screening: not due for screening -- osteoporosis screening: not due -- tobacco screening: not using -- alcohol screening: AUDIT questionnaire indicates low-risk usage. -- family history of breast cancer screening: done.  not at high risk. -- no evidence of domestic violence or intimate partner violence. -- STD screening: gonorrhea/chlamydia NAAT collected -- pap smear collected per ASCCP guidelines  Right lower quadrant pain: We did discuss pelvic inflammatory disease as a possible etiology.  However, her pain is so minimal today and she states that she is at such low risk due to sexual inactivity that it seems unlikely in her mind.  We talked about the progression of PID and possible symptoms.  Will obtain a pelvic ultrasound to see if there is a gynecologic etiology.  Further, we discussed that her symptoms could also be consistent with appendicitis.  Should her symptoms worsen, she should report to emergency room for full evaluation without delay.  She voiced understanding and agreement with that plan.  Thomasene Mohair, MD 06/12/2019 2:04 PM

## 2019-06-13 ENCOUNTER — Emergency Department: Payer: Medicaid Other

## 2019-06-13 ENCOUNTER — Observation Stay
Admission: EM | Admit: 2019-06-13 | Discharge: 2019-06-14 | Disposition: A | Discharge status: Home / Self Care | Payer: Medicaid Other | Attending: Obstetrics and Gynecology | Admitting: Obstetrics and Gynecology

## 2019-06-13 ENCOUNTER — Encounter: Payer: Self-pay | Admitting: Emergency Medicine

## 2019-06-13 DIAGNOSIS — N7093 Salpingitis and oophoritis, unspecified: Secondary | ICD-10-CM | POA: Diagnosis present

## 2019-06-13 DIAGNOSIS — R52 Pain, unspecified: Secondary | ICD-10-CM

## 2019-06-13 DIAGNOSIS — R1031 Right lower quadrant pain: Secondary | ICD-10-CM | POA: Diagnosis not present

## 2019-06-13 DIAGNOSIS — Z20828 Contact with and (suspected) exposure to other viral communicable diseases: Secondary | ICD-10-CM | POA: Diagnosis not present

## 2019-06-13 DIAGNOSIS — N7091 Salpingitis, unspecified: Secondary | ICD-10-CM | POA: Insufficient documentation

## 2019-06-13 LAB — COMPREHENSIVE METABOLIC PANEL
ALT: 11 U/L (ref 0–44)
AST: 12 U/L — ABNORMAL LOW (ref 15–41)
Albumin: 3.9 g/dL (ref 3.5–5.0)
Alkaline Phosphatase: 82 U/L (ref 38–126)
Anion gap: 10 (ref 5–15)
BUN: 12 mg/dL (ref 6–20)
CO2: 24 mmol/L (ref 22–32)
Calcium: 9 mg/dL (ref 8.9–10.3)
Chloride: 106 mmol/L (ref 98–111)
Creatinine, Ser: 0.84 mg/dL (ref 0.44–1.00)
GFR calc Af Amer: >60 mL/min (ref >60)
GFR calc non Af Amer: >60 mL/min (ref >60)
Glucose, Bld: 112 mg/dL — ABNORMAL HIGH (ref 70–99)
Potassium: 3.5 mmol/L (ref 3.5–5.1)
Sodium: 140 mmol/L (ref 135–145)
Total Bilirubin: 0.4 mg/dL (ref 0.3–1.2)
Total Protein: 8.1 g/dL (ref 6.5–8.1)

## 2019-06-13 LAB — URINALYSIS, COMPLETE (UACMP) WITH MICROSCOPIC
Bacteria, UA: NONE SEEN
Bilirubin Urine: NEGATIVE
Glucose, UA: NEGATIVE mg/dL
Hgb urine dipstick: NEGATIVE
Ketones, ur: NEGATIVE mg/dL
Nitrite: NEGATIVE
Protein, ur: 100 mg/dL — AB
Specific Gravity, Urine: 1.033 — ABNORMAL HIGH (ref 1.005–1.030)
pH: 5 (ref 5.0–8.0)

## 2019-06-13 LAB — WET PREP, GENITAL
Sperm: NONE SEEN
Trich, Wet Prep: NONE SEEN
Yeast Wet Prep HPF POC: NONE SEEN

## 2019-06-13 LAB — CBC
HCT: 38.4 % (ref 36.0–46.0)
Hemoglobin: 12.5 g/dL (ref 12.0–15.0)
MCH: 27.8 pg (ref 26.0–34.0)
MCHC: 32.6 g/dL (ref 30.0–36.0)
MCV: 85.3 fL (ref 80.0–100.0)
Platelets: 323 10*3/uL (ref 150–400)
RBC: 4.5 MIL/uL (ref 3.87–5.11)
RDW: 12.9 % (ref 11.5–15.5)
WBC: 16.1 10*3/uL — ABNORMAL HIGH (ref 4.0–10.5)
nRBC: 0 % (ref 0.0–0.2)

## 2019-06-13 LAB — LIPASE, BLOOD: Lipase: 18 U/L (ref 11–51)

## 2019-06-13 LAB — SARS CORONAVIRUS 2 (TAT 6-24 HRS): SARS Coronavirus 2: NEGATIVE

## 2019-06-13 LAB — HIV ANTIBODY (ROUTINE TESTING W REFLEX): HIV Screen 4th Generation wRfx: NONREACTIVE

## 2019-06-13 LAB — POCT PREGNANCY, URINE: Preg Test, Ur: NEGATIVE

## 2019-06-13 MED ORDER — SODIUM CHLORIDE 0.9 % IV SOLN
2.0000 g | Freq: Four times a day (QID) | INTRAVENOUS | Status: DC
  Filled 2019-06-13 (×3): qty 2

## 2019-06-13 MED ORDER — KETOROLAC TROMETHAMINE 30 MG/ML IJ SOLN
15.0000 mg | Freq: Once | INTRAMUSCULAR | Status: AC
  Administered 2019-06-13: 05:00:00 15 mg via INTRAVENOUS
  Filled 2019-06-13: qty 1

## 2019-06-13 MED ORDER — OXYCODONE-ACETAMINOPHEN 5-325 MG PO TABS
2.0000 | ORAL_TABLET | Freq: Four times a day (QID) | ORAL | Status: DC | PRN
  Administered 2019-06-13: 2 via ORAL
  Filled 2019-06-13: qty 2

## 2019-06-13 MED ORDER — DOXYCYCLINE HYCLATE 100 MG PO TABS
100.0000 mg | ORAL_TABLET | Freq: Two times a day (BID) | ORAL | Status: DC
  Administered 2019-06-13 – 2019-06-14 (×2): 100 mg via ORAL
  Filled 2019-06-13 (×3): qty 1

## 2019-06-13 MED ORDER — ONDANSETRON HCL 4 MG/2ML IJ SOLN
4.0000 mg | Freq: Once | INTRAMUSCULAR | Status: AC
  Administered 2019-06-13: 4 mg via INTRAVENOUS
  Filled 2019-06-13: qty 2

## 2019-06-13 MED ORDER — ONDANSETRON HCL 4 MG/2ML IJ SOLN
4.0000 mg | Freq: Once | INTRAMUSCULAR | Status: AC
  Administered 2019-06-13: 08:00:00 4 mg via INTRAVENOUS
  Filled 2019-06-13: qty 2

## 2019-06-13 MED ORDER — ONDANSETRON 4 MG PO TBDP
4.0000 mg | ORAL_TABLET | Freq: Four times a day (QID) | ORAL | Status: DC | PRN
  Administered 2019-06-14: 12:00:00 4 mg via ORAL
  Filled 2019-06-13: qty 1

## 2019-06-13 MED ORDER — SODIUM CHLORIDE 0.9 % IV SOLN
2.0000 g | Freq: Once | INTRAVENOUS | Status: AC
  Administered 2019-06-13: 08:00:00 2 g via INTRAVENOUS
  Filled 2019-06-13: qty 2

## 2019-06-13 MED ORDER — DOXYCYCLINE HYCLATE 100 MG PO TABS
100.0000 mg | ORAL_TABLET | Freq: Once | ORAL | Status: AC
  Administered 2019-06-13: 07:00:00 100 mg via ORAL
  Filled 2019-06-13: qty 1

## 2019-06-13 MED ORDER — SODIUM CHLORIDE 0.9 % IV SOLN
2.0000 g | Freq: Four times a day (QID) | INTRAVENOUS | Status: DC
  Administered 2019-06-13 – 2019-06-14 (×4): 2 g via INTRAVENOUS
  Filled 2019-06-13 (×7): qty 2

## 2019-06-13 MED ORDER — OXYCODONE-ACETAMINOPHEN 5-325 MG PO TABS
1.0000 | ORAL_TABLET | Freq: Four times a day (QID) | ORAL | Status: DC | PRN
  Administered 2019-06-13 – 2019-06-14 (×3): 1 via ORAL
  Filled 2019-06-13 (×3): qty 1

## 2019-06-13 MED ORDER — KCL IN DEXTROSE-NACL 20-5-0.45 MEQ/L-%-% IV SOLN
INTRAVENOUS | Status: DC
  Administered 2019-06-13 (×2): via INTRAVENOUS
  Filled 2019-06-13 (×4): qty 1000

## 2019-06-13 NOTE — ED Notes (Signed)
Admitting provider at bedside.

## 2019-06-13 NOTE — ED Notes (Signed)
Pt returned from US

## 2019-06-13 NOTE — Progress Notes (Addendum)
Patient is feeling much better with the antibiotics. She is resting comfortably. She does not feel febrile. She tolerated a diet today. She is not nauseous.  Abdomen is soft. No guarding, no peritoneal signs, no rebound.  Discussed plan of care. Will plan for at least 24 hours of antibiotics, possible discharge tomorrow with oral antibiotics if she continues to clinically improve.   Adrian Prows MD [facility], [facility] 06/13/2019 4:18 PM

## 2019-06-13 NOTE — ED Notes (Signed)
Pt requesting pain medication, Dr. Glennon Mac paged for med order

## 2019-06-13 NOTE — H&P (Signed)
GYNECOLOGY ADMISSION HISTORY AND PHYSICAL NOTE    Attending Provider: Rudene Re, MD  SHAKIRRA BUEHLER [id number] 06/13/2019 10:09 AM    Chief Complaint:   [name] [name] is a 23 y.o. G1P1001 premenopausal female seen at the request of Dr. Celene Squibb for evaluation of right lower quadrant abdominal pain and likely right pyosalpinx.    History of Present Ilness:   The patient originally presented to clinic for me yesterday with a 3-day history of right lower quadrant abdominal pain.  The pain was originally quite debilitating and she described the pain as sharp.  The pain comes and goes.  It got worse overnight and that is why she came to the emergency room this morning.  The pain originally was on her lower abdomen and eventually localized to her right lower quadrant.  The pain does not radiate.  Alleviating factors: Ibuprofen did not help.  She also used a heating pad, which helped a little bit.  Soaking in warm water.  Aggravating factors: None.  Associated symptoms: Nausea with emesis.  She denies fevers, chills, diarrhea, and constipation.  She denies hematochezia.  She denies urinary symptoms and hematuria.  She denies anorexia.   Past Medical History:  Diagnosis Date  . Gestational diabetes    Past Surgical History:  Procedure Laterality Date  . MOUTH SURGERY     Allergies  Allergen Reactions  . Peanut-Containing Drug Products Swelling    Tongue swelling   Prior to Admission medications   Medication Sig Start Date End Date Taking? Authorizing Provider  ibuprofen (ADVIL,MOTRIN) 600 MG tablet Take 1 tablet (600 mg total) by mouth every 6 (six) hours. Patient not taking: Reported on 10/04/2018 08/20/18   Dalia Heading, CNM  Prenatal Vit-Fe Fumarate-FA (PRENATAL VITAMIN) 27-0.8 MG TABS Take 1 tablet by mouth daily. Patient not taking: Reported on 06/13/2019 08/20/18   Dalia Heading, CNM    Obstetric History: She is a G23P1001 female s/p SVD.     Social  History:  She  reports that she has never smoked. She has never used smokeless tobacco. She reports previous drug use. She reports that she does not drink alcohol.  Family History:  family history includes Cervical cancer in her maternal great-grandmother; Diabetes in an other family member; Hypertension in her maternal grandmother.   Review of Systems:   Review of Systems  Constitutional: Negative.  Negative for chills and fever.  HENT: Negative.   Eyes: Negative.   Respiratory: Negative.   Cardiovascular: Negative.   Gastrointestinal: Positive for abdominal pain, nausea and vomiting. Negative for blood in stool, constipation, diarrhea and melena.  Genitourinary: Negative.  Negative for dysuria, flank pain, frequency, hematuria and urgency.  Musculoskeletal: Negative.   Skin: Negative.   Neurological: Negative.   Psychiatric/Behavioral: Negative.      Objective    BP 125/75 (BP Location: Left Arm)   Pulse 79   Temp 98.2 F (36.8 C) (Oral)   Resp 18   LMP 06/09/2019   SpO2 100% Comment: Room Air Physical Exam  Physical Exam Constitutional:      General: She is not in acute distress.    Appearance: Normal appearance. She is well-developed.  Genitourinary:     Genitourinary Comments: Deferred by me. ER MD reports copious pus coming from external cervical os (this was not present yesterday in clinic when I examined the patient).  HENT:     Head: Normocephalic and atraumatic.  Eyes:     General: No scleral icterus.  Conjunctiva/sclera: Conjunctivae normal.  Neck:     Musculoskeletal: Normal range of motion and neck supple.  Cardiovascular:     Rate and Rhythm: Normal rate and regular rhythm.     Heart sounds: No murmur. No friction rub. No gallop.   Pulmonary:     Effort: Pulmonary effort is normal. No respiratory distress.     Breath sounds: Normal breath sounds. No wheezing or rales.  Abdominal:     General: Bowel sounds are normal. There is no distension.      Palpations: Abdomen is soft. There is no mass.     Tenderness: There is abdominal tenderness (most pronounced in RLQ). There is no guarding or rebound.  Musculoskeletal: Normal range of motion.  Neurological:     General: No focal deficit present.     Mental Status: She is alert and oriented to person, place, and time.     Cranial Nerves: No cranial nerve deficit.  Skin:    General: Skin is warm and dry.     Findings: No erythema.  Psychiatric:        Mood and Affect: Mood normal.        Behavior: Behavior normal.        Judgment: Judgment normal.      Laboratory Results:   Lab Results  Component Value Date   WBC 16.1 (H) 06/13/2019   RBC 4.50 06/13/2019   HGB 12.5 06/13/2019   HCT 38.4 06/13/2019   PLT 323 06/13/2019   NA 140 06/13/2019   K 3.5 06/13/2019   CREATININE 0.84 06/13/2019   Lab Results  Component Value Date   PREGTESTUR NEGATIVE 06/13/2019    Imaging Results:  [place] Pelvic Complete W Transvaginal And Torsion R/o  Result Date: 06/13/2019 CLINICAL DATA:  Right lower quadrant pain for 5 days EXAM: TRANSABDOMINAL AND TRANSVAGINAL ULTRASOUND OF PELVIS DOPPLER ULTRASOUND OF OVARIES TECHNIQUE: Both transabdominal and transvaginal ultrasound examinations of the pelvis were performed. Transabdominal technique was performed for global imaging of the pelvis including uterus, ovaries, adnexal regions, and pelvic cul-de-sac. It was necessary to proceed with endovaginal exam following the transabdominal exam to visualize the ovaries. Color and duplex Doppler ultrasound was utilized to evaluate blood flow to the ovaries. COMPARISON:  None similar FINDINGS: Uterus Measurements: 8 x 4 x 6 cm = volume: 100 mL. No fibroids or other mass visualized. Endometrium Thickness: 5 mm.  Minimal fluid in the fundic endometrial cavity Right ovary Measurements: 37 x 16 x 29 mm = volume: 9.4 mL. Normal appearance of the ovary. There is a tubular structure with probable thickened endosalpingeal folds  that is peripherally hypervascular and contains mid level echoes. Left ovary Measurements: 38 x 23 x 28 mm = volume: 13 mL. Normal appearance/no adnexal mass. Pulsed Doppler evaluation of both ovaries demonstrates normal low-resistance arterial and venous waveforms. Other findings No abnormal free fluid. IMPRESSION: 1. Dilated right tube containing particulate material, probable pyosalpinx. 2. Normal appearance of the ovaries and uterus. Electronically Signed   By: Marnee SpringJonathon  Watts M.D.   On: 06/13/2019 06:25      Assessment & Plan   Burnell Blanksasheqa M Zellner is a 23 y.o. G1P1001 premenopausal female with right lower quadrant pain and likely right pyosalpinx.  Plan:  1.  Admit for IV antibiotics (Cefoxitin 2g Q 6 hours and Doxycycline 100 mg po bid).  Will consider re-imaging in 24-48 hours to assess improvement and possible outpatient treatment from that point vs. Need for drainage either with interventional radiology or surgically.  2. May have regular diet for now. 3. Cervical cultures pending from my exam yesterday and swab taken this morning.  4. Covid testing for admission, though reports no symptoms. 5. Pain and nausea medication, as needed.   Thomasene Mohair, MD 06/13/2019 10:09 AM

## 2019-06-13 NOTE — ED Provider Notes (Addendum)
[facility] Emergency Department Provider Note  ____________________________________________  Time seen: Approximately 5:56 AM  I have reviewed the triage vital signs and the nursing notes.   HISTORY  Chief Complaint Abdominal Pain   HPI [name] [name] is a 23 y.o. female who presents for evaluation of abdominal pain.  Patient reports 5 days of right lower quadrant abdominal pain that is sharp, severe, constant and radiating into her right lower back.  Patient reports that her menstrual period ended yesterday.  Initially she attributed to that however since the period ended the pain has persisted.  She has had nausea and small amount of nonbloody nonbilious emesis.  No vaginal discharge, no vaginal bleeding, no dysuria or hematuria, no diarrhea or constipation, no fever or chills.  Patient denies any prior abdominal surgeries.  Reports having a cyst in her ovary in the past.  She saw her OB/GYN yesterday for this pain.  She had a Pap smear and STD check but no other imaging studies.   Patient denies vaginal discharge. Last intercourse 1 month ago.  Past Medical History:  Diagnosis Date  . Gestational diabetes     Patient Active Problem List   Diagnosis Date Noted  . Normal vaginal delivery 08/20/2018  . Postpartum care following vaginal delivery 08/20/2018  . Indication for care in labor and delivery, antepartum 08/17/2018  . High-risk pregnancy, third trimester 07/12/2018  . Diet controlled gestational diabetes mellitus (GDM) in third trimester 06/26/2018  . History of substance use 06/12/2018  . Supervision of normal first pregnancy, antepartum 01/18/2018    Past Surgical History:  Procedure Laterality Date  . MOUTH SURGERY      Prior to Admission medications   Medication Sig Start Date End Date Taking? Authorizing Provider  ibuprofen (ADVIL,MOTRIN) 600 MG tablet Take 1 tablet (600 mg total) by mouth every 6 (six) hours. Patient not taking:  Reported on 10/04/2018 08/20/18   Dalia Heading, CNM  Prenatal Vit-Fe Fumarate-FA (PRENATAL VITAMIN) 27-0.8 MG TABS Take 1 tablet by mouth daily. 08/20/18   Dalia Heading, CNM    Allergies Peanut-containing drug products  Family History  Problem Relation Age of Onset  . Hypertension Maternal Grandmother   . Diabetes Other        maternal great aunts and uncles  . Cervical cancer Maternal Great-grandmother     Social History Social History   Tobacco Use  . Smoking status: Never Smoker  . Smokeless tobacco: Never Used  Substance Use Topics  . Alcohol use: Never    Frequency: Never  . Drug use: Not Currently    Comment: UDS positive for MJ at [facility]    Review of Systems  Constitutional: Negative for fever. Eyes: Negative for visual changes. ENT: Negative for sore throat. Neck: No neck pain  Cardiovascular: Negative for chest pain. Respiratory: Negative for shortness of breath. Gastrointestinal: + RLQ abdominal pain, nausea, and vomiting. No diarrhea. Genitourinary: Negative for dysuria. Musculoskeletal: Negative for back pain. Skin: Negative for rash. Neurological: Negative for headaches, weakness or numbness. Psych: No SI or HI  ____________________________________________   PHYSICAL EXAM:  VITAL SIGNS: ED Triage Vitals [06/13/19 0324]  Enc Vitals Group     BP 137/78     Pulse Rate 93     Resp 18     Temp 98.6 F (37 C)     Temp Source Oral     SpO2 100 %     Weight      Height  Head Circumference      Peak Flow      Pain Score      Pain Loc      Pain Edu?      Excl. in GC?     Constitutional: Alert and oriented. Well appearing and in no apparent distress. HEENT:      Head: Normocephalic and atraumatic.         Eyes: Conjunctivae are normal. Sclera is non-icteric.       Mouth/Throat: Mucous membranes are moist.       Neck: Supple with no signs of meningismus. Cardiovascular: Regular rate and rhythm. No murmurs, gallops, or rubs. 2+  symmetrical distal pulses are present in all extremities. No JVD. Respiratory: Normal respiratory effort. Lungs are clear to auscultation bilaterally. No wheezes, crackles, or rhonchi.  Gastrointestinal: Soft, RLQ tenderness, and non distended with positive bowel sounds. No rebound or guarding. Pelvic exam: Normal external genitalia, no rashes or lesions. Copious amount of purulent discharge. Os closed. No cervical motion tenderness.  No uterine or adnexal tenderness.   Musculoskeletal: Nontender with normal range of motion in all extremities. No edema, cyanosis, or erythema of extremities. Neurologic: Normal speech and language. Face is symmetric. Moving all extremities. No gross focal neurologic deficits are appreciated. Skin: Skin is warm, dry and intact. No rash noted. Psychiatric: Mood and affect are normal. Speech and behavior are normal.  ____________________________________________   LABS (all labs ordered are listed, but only abnormal results are displayed)  Labs Reviewed  COMPREHENSIVE METABOLIC PANEL - Abnormal; Notable for the following components:      Result Value   Glucose, Bld 112 (*)    AST 12 (*)    All other components within normal limits  CBC - Abnormal; Notable for the following components:   WBC 16.1 (*)    All other components within normal limits  URINALYSIS, COMPLETE (UACMP) WITH MICROSCOPIC - Abnormal; Notable for the following components:   Color, Urine YELLOW (*)    APPearance CLOUDY (*)    Specific Gravity, Urine 1.033 (*)    Protein, ur 100 (*)    Leukocytes,Ua LARGE (*)    All other components within normal limits  WET PREP, GENITAL  LIPASE, BLOOD  POC URINE PREG, ED  POCT PREGNANCY, URINE   ____________________________________________  EKG  none  ____________________________________________  RADIOLOGY  I have personally reviewed the images performed during this visit and I agree with the Radiologist's read.   Interpretation by  Radiologist:  [place] Pelvic Complete W Transvaginal And Torsion R/o  Result Date: 06/13/2019 CLINICAL DATA:  Right lower quadrant pain for 5 days EXAM: TRANSABDOMINAL AND TRANSVAGINAL ULTRASOUND OF PELVIS DOPPLER ULTRASOUND OF OVARIES TECHNIQUE: Both transabdominal and transvaginal ultrasound examinations of the pelvis were performed. Transabdominal technique was performed for global imaging of the pelvis including uterus, ovaries, adnexal regions, and pelvic cul-de-sac. It was necessary to proceed with endovaginal exam following the transabdominal exam to visualize the ovaries. Color and duplex Doppler ultrasound was utilized to evaluate blood flow to the ovaries. COMPARISON:  None similar FINDINGS: Uterus Measurements: 8 x 4 x 6 cm = volume: 100 mL. No fibroids or other mass visualized. Endometrium Thickness: 5 mm.  Minimal fluid in the fundic endometrial cavity Right ovary Measurements: 37 x 16 x 29 mm = volume: 9.4 mL. Normal appearance of the ovary. There is a tubular structure with probable thickened endosalpingeal folds that is peripherally hypervascular and contains mid level echoes. Left ovary Measurements: 38 x 23 x 28  mm = volume: 13 mL. Normal appearance/no adnexal mass. Pulsed Doppler evaluation of both ovaries demonstrates normal low-resistance arterial and venous waveforms. Other findings No abnormal free fluid. IMPRESSION: 1. Dilated right tube containing particulate material, probable pyosalpinx. 2. Normal appearance of the ovaries and uterus. Electronically Signed   By: Marnee Spring M.D.   On: 06/13/2019 06:25      ____________________________________________   PROCEDURES  Procedure(s) performed: None Procedures Critical Care performed:  None ____________________________________________   INITIAL IMPRESSION / ASSESSMENT AND PLAN / ED COURSE   23 y.o. female who presents for evaluation of RLQ abdominal pain x 5 days.  Ddx ovarian cyst, ovarian torsion, PID, TOA, appendicitis,  kidney stone, UTI, pregnancy, ectopic.  Patient is well-appearing and does not look in any apparent distress, abdomen is soft with right lower quadrant tenderness with no rebound or guarding. Pelvic exam showing copious amount of purulent discharge with no CMT.  Pregnancy test negative.  CBC shows a leukocytosis with white count of 16.1.  CMP and lipase are within normal limits.  UA with no evidence of UTI.  Will give Toradol and Zofran for symptom relief.  We will send patient for a transvaginal ultrasound and if that is negative we will send patient for CT to rule out appendicitis.    _________________________ 6:38 AM on 06/13/2019 -----------------------------------------  [place] consistent with pyosalpinx. Abx ordered. STD testing pending. Dr. Jean Rosenthal consulted. Awaiting recs.  _________________________ 6:59 AM on 06/13/2019 -----------------------------------------  Dr. Jean Rosenthal recommended admission for IV abx.     As part of my medical decision making, I reviewed the following data within the electronic MEDICAL RECORD NUMBER Nursing notes reviewed and incorporated, Labs reviewed , Old chart reviewed, Radiograph reviewed , A consult was requested and obtained from this/these consultant(s) ObGYN, Notes from prior ED visits and [place] Controlled Substance Database   Patient was evaluated in Emergency Department today for the symptoms described in the history of present illness. Patient was evaluated in the context of the global COVID-19 pandemic, which necessitated consideration that the patient might be at risk for infection with the SARS-CoV-2 virus that causes COVID-19. Institutional protocols and algorithms that pertain to the evaluation of patients at risk for COVID-19 are in a state of rapid change based on information released by regulatory bodies including the CDC and federal and state organizations. These policies and algorithms were followed during the patient's care in the ED.    ____________________________________________   FINAL CLINICAL IMPRESSION(S) / ED DIAGNOSES   Final diagnoses:  RLQ abdominal pain  Right pyosalpinx      NEW MEDICATIONS STARTED DURING THIS VISIT:  ED Discharge Orders    None       Note:  This document was prepared using Dragon voice recognition software and may include unintentional dictation errors.    Don Perking, [place], MD 06/13/19 [id number]    Nita Sickle, MD 06/13/19 [phone]

## 2019-06-13 NOTE — ED Notes (Signed)
Patient transported to Ultrasound 

## 2019-06-13 NOTE — ED Triage Notes (Signed)
Pt c/o lower right abdominal pain that radiates into the lower right side of back, x5 days. Pt seen OBGYN on 10/21 and had pap smear and STD check. Pt to ED due to increased and unresolved pain.

## 2019-06-14 DIAGNOSIS — N7091 Salpingitis, unspecified: Secondary | ICD-10-CM | POA: Diagnosis not present

## 2019-06-14 LAB — CBC
HCT: 34.9 % — ABNORMAL LOW (ref 36.0–46.0)
Hemoglobin: 11.2 g/dL — ABNORMAL LOW (ref 12.0–15.0)
MCH: 27.7 pg (ref 26.0–34.0)
MCHC: 32.1 g/dL (ref 30.0–36.0)
MCV: 86.4 fL (ref 80.0–100.0)
Platelets: 309 10*3/uL (ref 150–400)
RBC: 4.04 MIL/uL (ref 3.87–5.11)
RDW: 12.8 % (ref 11.5–15.5)
WBC: 11.7 10*3/uL — ABNORMAL HIGH (ref 4.0–10.5)
nRBC: 0 % (ref 0.0–0.2)

## 2019-06-14 MED ORDER — OXYCODONE-ACETAMINOPHEN 5-325 MG PO TABS: 1.0000 | ORAL_TABLET | Freq: Four times a day (QID) | ORAL | 0 refills | Status: DC | PRN

## 2019-06-14 MED ORDER — DOXYCYCLINE HYCLATE 100 MG PO TABS: 100.0000 mg | ORAL_TABLET | Freq: Two times a day (BID) | ORAL | 0 refills | Status: AC

## 2019-06-14 MED ORDER — ONDANSETRON 4 MG PO TBDP: 4.0000 mg | ORAL_TABLET | Freq: Four times a day (QID) | ORAL | 0 refills | Status: DC | PRN

## 2019-06-14 MED ORDER — IBUPROFEN 600 MG PO TABS: 600.0000 mg | ORAL_TABLET | Freq: Four times a day (QID) | ORAL | 0 refills | Status: DC

## 2019-06-14 NOTE — Progress Notes (Signed)
Patient discharged to home. Discharge instructions and prescriptions given and reviewed with patient. Patient verbalized understanding. Escorted out by staff.

## 2019-06-14 NOTE — Discharge Summary (Signed)
Physician Discharge Summary  Patient ID: [name] [name] MRN: [id number] DOB/AGE: 01/15/1996 23 y.o.  Admit date: 06/13/2019 Discharge date: 06/14/2019  Admission Diagnoses: TOA  Discharge Diagnoses:  Active Problems:   Right pyosalpinx   Discharged Condition: stable  Hospital Course: Patient admitted for IV antibiotics for a right TOA.  GC/CT pending at time of discharge.  She reported rapid improving of starting parenteral treatment.  At the time of discharge the patient was afebrile, displaying good po tolerance, and had a non-acute abdomen. She will follow up in 1 week in clinic.  We will continue her on a 14 day course of doxycyline po with plan for repeat imaging outpatient.      Consults: None  Significant Diagnostic Studies:  Results for orders placed or performed during the hospital encounter of 06/13/19 (from the past 24 hour(s))  HIV Antibody (routine testing w rflx)     Status: None   Collection Time: 06/13/19 10:08 AM  Result Value Ref Range   HIV Screen 4th Generation wRfx NON REACTIVE NON REACTIVE  CBC     Status: Abnormal   Collection Time: 06/14/19  5:47 AM  Result Value Ref Range   WBC 11.7 (H) 4.0 - 10.5 K/uL   RBC 4.04 3.87 - 5.11 MIL/uL   Hemoglobin 11.2 (L) 12.0 - 15.0 g/dL   HCT 34.9 (L) 36.0 - 46.0 %   MCV 86.4 80.0 - 100.0 fL   MCH 27.7 26.0 - 34.0 pg   MCHC 32.1 30.0 - 36.0 g/dL   RDW 12.8 11.5 - 15.5 %   Platelets 309 150 - 400 K/uL   nRBC 0.0 0.0 - 0.2 %     Treatments: IV antibiotics  Discharge Exam: Blood pressure (!) 108/51, pulse 79, temperature 98.4 F (36.9 C), temperature source Oral, resp. rate 20, height 5' (1.524 m), weight 70.5 kg, last menstrual period 06/09/2019, SpO2 99 %, unknown if currently breastfeeding. General appearance: alert, appears stated age and no distress Resp: no increased work of breathing GI: soft, non-tender, non-distended, no rebound, no guarding  Disposition: Discharge disposition: 01-Home or Self  Care       Discharge Instructions    Activity as tolerated   Complete by: As directed    Call MD for:   Complete by: As directed    For heavy vaginal bleeding greater than 1 pad an hour   Call MD for:  hives   Complete by: As directed    Call MD for:  persistant dizziness or light-headedness   Complete by: As directed    Call MD for:  persistant nausea and vomiting   Complete by: As directed    Call MD for:  severe uncontrolled pain   Complete by: As directed    Call MD for:  temperature >100.4   Complete by: As directed    Diet general   Complete by: As directed      Allergies as of 06/14/2019      Reactions   Peanut-containing Drug Products Swelling   Tongue swelling      Medication List    TAKE these medications   doxycycline 100 MG tablet Commonly known as: VIBRA-TABS Take 1 tablet (100 mg total) by mouth every 12 (twelve) hours for 14 days.   ibuprofen 600 MG tablet Commonly known as: ADVIL Take 1 tablet (600 mg total) by mouth every 6 (six) hours.   ondansetron 4 MG disintegrating tablet Commonly known as: Zofran ODT Take 1 tablet (4 mg total) by  mouth every 6 (six) hours as needed for nausea.   oxyCODONE-acetaminophen 5-325 MG tablet Commonly known as: PERCOCET/ROXICET Take 1 tablet by mouth every 6 (six) hours as needed for moderate pain (pain score 4-7/10).   Prenatal Vitamin 27-0.8 MG Tabs Take 1 tablet by mouth daily.      Follow-up Information    Conard Novak, MD Follow up in 1 week(s).   Specialty: Obstetrics and Gynecology Contact information: [address] [place] [address] [phone]           Signed: Vena Austria 06/14/2019, 8:39 AM

## 2019-06-14 NOTE — Discharge Instructions (Signed)
Abdominal Pain, Adult    Many things can cause belly (abdominal) pain. Most times, belly pain is not dangerous. Many cases of belly pain can be watched and treated at home. Sometimes belly pain is serious, though. Your doctor will try to find the cause of your belly pain.  Follow these instructions at home:  · Take over-the-counter and prescription medicines only as told by your doctor. Do not take medicines that help you poop (laxatives) unless told to by your doctor.  · Drink enough fluid to keep your pee (urine) clear or pale yellow.  · Watch your belly pain for any changes.  · Keep all follow-up visits as told by your doctor. This is important.  Contact a doctor if:  · Your belly pain changes or gets worse.  · You are not hungry, or you lose weight without trying.  · You are having trouble pooping (constipated) or have watery poop (diarrhea) for more than 2-3 days.  · You have pain when you pee or poop.  · Your belly pain wakes you up at night.  · Your pain gets worse with meals, after eating, or with certain foods.  · You are throwing up and cannot keep anything down.  · You have a fever.  Get help right away if:  · Your pain does not go away as soon as your doctor says it should.  · You cannot stop throwing up.  · Your pain is only in areas of your belly, such as the right side or the left lower part of the belly.  · You have bloody or black poop, or poop that looks like tar.  · You have very bad pain, cramping, or bloating in your belly.  · You have signs of not having enough fluid or water in your body (dehydration), such as:  ? Dark pee, very little pee, or no pee.  ? Cracked lips.  ? Dry mouth.  ? Sunken eyes.  ? Sleepiness.  ? Weakness.  This information is not intended to replace advice given to you by your health care provider. Make sure you discuss any questions you have with your health care provider.  Document Released: 01/25/2008 Document Revised: 02/26/2016 Document Reviewed: 01/20/2016  [employer]vier  Interactive Patient Education © 2020 [employer]

## 2019-06-15 LAB — GC/CHLAMYDIA PROBE AMP
Chlamydia trachomatis, NAA: POSITIVE — AB
Neisseria Gonorrhoeae by PCR: POSITIVE — AB

## 2019-06-18 LAB — CYTOLOGY - PAP
Chlamydia: POSITIVE — AB
Comment: NEGATIVE
Comment: NORMAL
Neisseria Gonorrhea: POSITIVE — AB

## 2019-06-19 ENCOUNTER — Ambulatory Visit: Payer: Medicaid Other | Admitting: Obstetrics and Gynecology

## 2019-06-24 ENCOUNTER — Encounter: Payer: Self-pay | Admitting: Obstetrics & Gynecology

## 2019-06-24 ENCOUNTER — Ambulatory Visit (INDEPENDENT_AMBULATORY_CARE_PROVIDER_SITE_OTHER): Payer: Medicaid Other | Admitting: Obstetrics & Gynecology

## 2019-06-24 ENCOUNTER — Other Ambulatory Visit: Payer: Self-pay

## 2019-06-24 VITALS — BP 120/80 | Ht 60.0 in | Wt 149.0 lb

## 2019-06-24 DIAGNOSIS — N7093 Salpingitis and oophoritis, unspecified: Secondary | ICD-10-CM

## 2019-06-24 NOTE — Progress Notes (Signed)
History of Present Illness:  [name] [name] is a 23 y.o. who was diagnosed with a painful PID associated w RLQ pain, thus started on  Current Meds  Medication Sig  . doxycycline (VIBRA-TABS) 100 MG tablet Take 1 tablet (100 mg total) by mouth every 12 (twelve) hours for 14 days.  Marland Kitchen ibuprofen (ADVIL) 600 MG tablet Take 1 tablet (600 mg total) by mouth every 6 (six) hours.  . ondansetron (ZOFRAN ODT) 4 MG disintegrating tablet Take 1 tablet (4 mg total) by mouth every 6 (six) hours as needed for nausea.  Marland Kitchen oxyCODONE-acetaminophen (PERCOCET/ROXICET) 5-325 MG tablet Take 1 tablet by mouth every 6 (six) hours as needed for moderate pain (pain score 4-7/10).   approximately 1 weeks ago. Since that time, she states that her symptoms are improving.  Min-no pain, no discharge, no fever, no nausea, and tolerates meds well.  PMHx: She  has a past medical history of Gestational diabetes. Also,  has a past surgical history that includes Mouth surgery., family history includes Cervical cancer in her maternal great-grandmother; Diabetes in an other family member; Hypertension in her maternal grandmother.,  reports that she has never smoked. She has never used smokeless tobacco. She reports previous drug use. She reports that she does not drink alcohol. Current Meds  Medication Sig  . doxycycline (VIBRA-TABS) 100 MG tablet Take 1 tablet (100 mg total) by mouth every 12 (twelve) hours for 14 days.  Marland Kitchen ibuprofen (ADVIL) 600 MG tablet Take 1 tablet (600 mg total) by mouth every 6 (six) hours.  . ondansetron (ZOFRAN ODT) 4 MG disintegrating tablet Take 1 tablet (4 mg total) by mouth every 6 (six) hours as needed for nausea.  Marland Kitchen oxyCODONE-acetaminophen (PERCOCET/ROXICET) 5-325 MG tablet Take 1 tablet by mouth every 6 (six) hours as needed for moderate pain (pain score 4-7/10).  . Also, is allergic to peanut-containing drug products..  Review of Systems  All other systems reviewed and are negative.   Physical Exam:   BP 120/80   Ht 5' (1.524 m)   Wt 149 lb (67.6 kg)   LMP 06/09/2019   BMI 29.10 kg/m  Body mass index is 29.1 kg/m. Constitutional: Well nourished, well developed female in no acute distress.  Abdomen: diffusely non tender to palpation, non distended, and no masses, hernias Neuro: Grossly intact Psych:  Normal mood and affect.    Results for orders placed or performed during the hospital encounter of 06/13/19  Wet prep, genital  Result Value Ref Range   Yeast Wet Prep HPF POC NONE SEEN NONE SEEN   Trich, Wet Prep NONE SEEN NONE SEEN   Clue Cells Wet Prep HPF POC PRESENT (A) NONE SEEN   WBC, Wet Prep HPF POC MANY (A) NONE SEEN   Sperm NONE SEEN   GC/Chlamydia Probe Amp  Result Value Ref Range   Chlamydia trachomatis, NAA Positive (A) Negative   Neisseria Gonorrhoeae by PCR Positive (A) Negative   CT/NG NAA Source URINE, RANDOM   SARS CORONAVIRUS 2 (TAT 6-24 HRS) Nasopharyngeal Nasopharyngeal Swab   Specimen: Nasopharyngeal Swab  Result Value Ref Range   SARS Coronavirus 2 NEGATIVE NEGATIVE  Lipase, blood  Result Value Ref Range   Lipase 18 11 - 51 U/L  Comprehensive metabolic panel  Result Value Ref Range   Sodium 140 135 - 145 mmol/L   Potassium 3.5 3.5 - 5.1 mmol/L   Chloride 106 98 - 111 mmol/L   CO2 24 22 - 32 mmol/L   Glucose, Bld  112 (H) 70 - 99 mg/dL   BUN 12 6 - 20 mg/dL   Creatinine, Ser 0.84 0.44 - 1.00 mg/dL   Calcium 9.0 8.9 - 10.3 mg/dL   Total Protein 8.1 6.5 - 8.1 g/dL   Albumin 3.9 3.5 - 5.0 g/dL   AST 12 (L) 15 - 41 U/L   ALT 11 0 - 44 U/L   Alkaline Phosphatase 82 38 - 126 U/L   Total Bilirubin 0.4 0.3 - 1.2 mg/dL   GFR calc non Af Amer >60 >60 mL/min   GFR calc Af Amer >60 >60 mL/min   Anion gap 10 5 - 15  CBC  Result Value Ref Range   WBC 16.1 (H) 4.0 - 10.5 K/uL   RBC 4.50 3.87 - 5.11 MIL/uL   Hemoglobin 12.5 12.0 - 15.0 g/dL   HCT 38.4 36.0 - 46.0 %   MCV 85.3 80.0 - 100.0 fL   MCH 27.8 26.0 - 34.0 pg   MCHC 32.6 30.0 - 36.0 g/dL   RDW  12.9 11.5 - 15.5 %   Platelets 323 150 - 400 K/uL   nRBC 0.0 0.0 - 0.2 %  Urinalysis, Complete w Microscopic  Result Value Ref Range   Color, Urine YELLOW (A) YELLOW   APPearance CLOUDY (A) CLEAR   Specific Gravity, Urine 1.033 (H) 1.005 - 1.030   pH 5.0 5.0 - 8.0   Glucose, UA NEGATIVE NEGATIVE mg/dL   Hgb urine dipstick NEGATIVE NEGATIVE   Bilirubin Urine NEGATIVE NEGATIVE   Ketones, ur NEGATIVE NEGATIVE mg/dL   Protein, ur 100 (A) NEGATIVE mg/dL   Nitrite NEGATIVE NEGATIVE   Leukocytes,Ua LARGE (A) NEGATIVE   RBC / HPF 6-10 0 - 5 RBC/hpf   WBC, UA 11-20 0 - 5 WBC/hpf   Bacteria, UA NONE SEEN NONE SEEN   Squamous Epithelial / LPF 11-20 0 - 5   Mucus PRESENT   HIV Antibody (routine testing w rflx)  Result Value Ref Range   HIV Screen 4th Generation wRfx NON REACTIVE NON REACTIVE  CBC  Result Value Ref Range   WBC 11.7 (H) 4.0 - 10.5 K/uL   RBC 4.04 3.87 - 5.11 MIL/uL   Hemoglobin 11.2 (L) 12.0 - 15.0 g/dL   HCT 34.9 (L) 36.0 - 46.0 %   MCV 86.4 80.0 - 100.0 fL   MCH 27.7 26.0 - 34.0 pg   MCHC 32.1 30.0 - 36.0 g/dL   RDW 12.8 11.5 - 15.5 %   Platelets 309 150 - 400 K/uL   nRBC 0.0 0.0 - 0.2 %  Pregnancy, urine POC  Result Value Ref Range   Preg Test, Ur NEGATIVE NEGATIVE    Assessment:  Problem List Items Addressed This Visit      Genitourinary   Right pyosalpinx - Primary    Resolving, much improved  Plan: She will undergo continue ABX for full 14 days in her medical therapy.  She was amenable to this plan and we will see her back next week for follow up US and re-assessment of her sx's.  Also plan to discuss PAP results at that appointment.  A total of 15 minutes were spent face-to-face with the patient during this encounter and over half of that time dealt with counseling and coordination of care.  Barnett Applebaum, MD, [facility], [facility] 06/24/2019  1:13 PM

## 2019-07-01 ENCOUNTER — Ambulatory Visit (INDEPENDENT_AMBULATORY_CARE_PROVIDER_SITE_OTHER): Payer: Medicaid Other | Admitting: Obstetrics and Gynecology

## 2019-07-01 ENCOUNTER — Other Ambulatory Visit: Payer: Self-pay

## 2019-07-01 ENCOUNTER — Ambulatory Visit (INDEPENDENT_AMBULATORY_CARE_PROVIDER_SITE_OTHER): Payer: Medicaid Other

## 2019-07-01 ENCOUNTER — Encounter: Payer: Self-pay | Admitting: Obstetrics and Gynecology

## 2019-07-01 VITALS — BP 118/74 | Ht 60.0 in | Wt 149.0 lb

## 2019-07-01 DIAGNOSIS — N920 Excessive and frequent menstruation with regular cycle: Secondary | ICD-10-CM | POA: Diagnosis not present

## 2019-07-01 DIAGNOSIS — R1031 Right lower quadrant pain: Secondary | ICD-10-CM

## 2019-07-01 DIAGNOSIS — A749 Chlamydial infection, unspecified: Secondary | ICD-10-CM | POA: Diagnosis not present

## 2019-07-01 DIAGNOSIS — Z01419 Encounter for gynecological examination (general) (routine) without abnormal findings: Secondary | ICD-10-CM

## 2019-07-01 DIAGNOSIS — A549 Gonococcal infection, unspecified: Secondary | ICD-10-CM | POA: Diagnosis not present

## 2019-07-01 DIAGNOSIS — N7093 Salpingitis and oophoritis, unspecified: Secondary | ICD-10-CM

## 2019-07-01 NOTE — Progress Notes (Signed)
Gynecology Ultrasound Follow Up   Chief Complaint  Patient presents with   Follow-up  Right pyosalpinx  History of Present Illness: Patient is a 23 y.o. female who presents today for ultrasound evaluation of the above   Ultrasound demonstrates the following findings Adnexa:  Still with right tubular structure.  May be less inflamed compared to prior, but still present.   Uterus: anteverted  with endometrial stripe  16.5 mm Additional: no free fluid.    Patient has no pain today.  She has completed her course of antibiotics.  She understands now that she had gonorrhea and chlamydia positive tests in the emergency room.  She understands that her partner would need to be treated prior to having intercourse again.  I explained the findings on ultrasound today.  The right tubal structure measures about 2.5 cm in diameter and does not appear significantly changed in size from October 22. One main difference is there is a separation of more dense from less dense debris in the fallopian tube on today's ultrasound.  Past Medical History:  Diagnosis Date   Gestational diabetes     Past Surgical History:  Procedure Laterality Date   MOUTH SURGERY       Family History  Problem Relation Age of Onset   Hypertension Maternal Grandmother    Diabetes Other        maternal great aunts and uncles   Cervical cancer Maternal Great-grandmother     Social History   Socioeconomic History   Marital status: Single    Spouse name: Not on file   Number of children: Not on file   Years of education: Not on file   Highest education level: Not on file  Occupational History   Not on file  Social Needs   Financial resource strain: Patient refused   Food insecurity    Worry: Never true    Inability: Never true   Transportation needs    Medical: No    Non-medical: No  Tobacco Use   Smoking status: Never Smoker   Smokeless tobacco: Never Used  Substance and Sexual Activity     Alcohol use: Never    Frequency: Never   Drug use: Not Currently    Comment: UDS positive for MJ at [facility]   Sexual activity: Yes    Birth control/protection: None  Lifestyle   Physical activity    Days per week: 0 days    Minutes per session: 0 min   Stress: Only a little  Relationships   Social connections    Talks on phone: More than three times a week    Gets together: More than three times a week    Attends religious service: Never    Active member of club or organization: No    Attends meetings of clubs or organizations: Never    Relationship status: Never married   Intimate partner violence    Fear of current or ex partner: No    Emotionally abused: No    Physically abused: No    Forced sexual activity: No  Other Topics Concern   Not on file  Social History Narrative   Not on file    Allergies  Allergen Reactions   Peanut-Containing Drug Products Swelling    Tongue swelling    Prior to Admission medications: denies    Physical Exam BP 118/74    Ht 5' (1.524 m)    Wt 149 lb (67.6 kg)    LMP 06/09/2019  BMI 29.10 kg/m    General: NAD HEENT: normocephalic, anicteric Pulmonary: No increased work of breathing Extremities: no edema, erythema, or tenderness Neurologic: Grossly intact, normal gait Psychiatric: mood appropriate, affect full  Imaging Results US Pelvis Transvaginal Non-ob (tv Only)  Result Date: 07/01/2019 Patient Name: [name] [name] DOB: 06/24/1996 MRN: [id number] ULTRASOUND REPORT Location: [facility] Date of Service: 07/01/2019 Indications:Pelvic Pain and Abnormal Uterine Bleeding Findings: The uterus is anteverted and measures 8.4 x 5.3 x 4.9 cm. Echo texture is homogenous without evidence of focal masses. The Endometrium measures 16.5 mm. Right Ovary measures 3.7 x 2.3 x 1.6  cm. It is normal in appearance. Left Ovary measures 3.3 x 3.0 x 2.4 cm. It is normal in appearance. There is no free fluid in the cul de sac. The right  fallopian tube is dilated with complex fluid. It measures in maximal diameter. Still present is a folded tubal structure that has components that appear to be heteroechoic, with possible settling of contents with less dense fluid on top. Size does not appear to be greatly changed from prior. Impression: 1. Thick endometrium. 2. Normal appearing ovaries. 3. The right fallopian tube is dilated with complex fluid. Deanna Artis, RT The ultrasound images and findings were reviewed by me and I agree with the above report. Thomasene Mohair, MD, [facility], [facility] 07/01/2019 10:34 AM       Assessment: 23 y.o. G1P1001  1. Right pyosalpinx   2. Gonorrhea   3. Chlamydia      Plan: Problem List Items Addressed This Visit      Genitourinary   Right pyosalpinx - Primary   Relevant Orders   US Transvaginal Non-OB ([facility])    Other Visit Diagnoses    Gonorrhea       Relevant Orders   US Transvaginal Non-OB ([facility])   Chlamydia       Relevant Orders   US Transvaginal Non-OB ([facility])     Will repeat ultrasound in about 3 weeks.  We will also test for test of cure for gonorrhea and chlamydia at that time.  If dilation of tubal structure continues, may need to move to salpingectomy.  She voiced understanding of this.  15 minutes spent in face to face discussion with > 50% spent in counseling,management, and coordination of care of her right pyosalpinx and gonorrhea and chlamydia pelvic inflammatory disease.   Thomasene Mohair, MD, [facility], [facility] 07/01/2019 10:49 AM

## 2019-07-22 ENCOUNTER — Other Ambulatory Visit: Payer: Self-pay

## 2019-07-22 ENCOUNTER — Encounter (INDEPENDENT_AMBULATORY_CARE_PROVIDER_SITE_OTHER): Payer: Medicaid Other | Admitting: Adult Health

## 2019-07-23 ENCOUNTER — Ambulatory Visit: Payer: Medicaid Other | Admitting: Obstetrics and Gynecology

## 2019-07-23 ENCOUNTER — Other Ambulatory Visit: Payer: Medicaid Other

## 2019-07-25 NOTE — Progress Notes (Deleted)
No visit . Could not close chart.

## 2019-08-12 ENCOUNTER — Other Ambulatory Visit: Payer: Self-pay

## 2019-08-12 ENCOUNTER — Ambulatory Visit (INDEPENDENT_AMBULATORY_CARE_PROVIDER_SITE_OTHER): Payer: Medicaid Other | Admitting: Obstetrics and Gynecology

## 2019-08-12 ENCOUNTER — Ambulatory Visit (INDEPENDENT_AMBULATORY_CARE_PROVIDER_SITE_OTHER): Payer: Medicaid Other

## 2019-08-12 VITALS — Wt 154.0 lb

## 2019-08-12 DIAGNOSIS — A549 Gonococcal infection, unspecified: Secondary | ICD-10-CM

## 2019-08-12 DIAGNOSIS — N7093 Salpingitis and oophoritis, unspecified: Secondary | ICD-10-CM | POA: Diagnosis not present

## 2019-08-12 DIAGNOSIS — A749 Chlamydial infection, unspecified: Secondary | ICD-10-CM

## 2019-08-12 DIAGNOSIS — R87612 Low grade squamous intraepithelial lesion on cytologic smear of cervix (LGSIL): Secondary | ICD-10-CM | POA: Diagnosis not present

## 2019-08-12 NOTE — Progress Notes (Signed)
Gynecology Ultrasound Follow Up   Chief Complaint  Patient presents with  . Follow-up    pt requests to do colpo another day    History of Present Illness: Patient is a 23 y.o. female who presents today for ultrasound evaluation of right pyosalpinx .  Ultrasound demonstrates the following findings Adnexa: right pyosalpinx still present, but much improved.  Only a much smaller portion of tube is affected. See measurements in report. No measurements given in prior report. However the cross section of the fallopian tube (likely coiled on itself) was about 7 cm.  The current measurement is much smaller.  Uterus: anteverted with endometrial stripe  7.4 mm Additional: No other significant findings  She reports no pain. She notes that she finished all of her antibiotics.    She is having her period today and declines colposcopy.  She voiced understanding that it is very important to accomplish her colposcopy to prevent cervical cancer.  This was discussed in great detail.    Past Medical History:  Diagnosis Date  . Allergy   . Gestational diabetes     Past Surgical History:  Procedure Laterality Date  . MOUTH SURGERY      Family History  Problem Relation Age of Onset  . Hypertension Maternal Grandmother   . Diabetes Other        maternal great aunts and uncles  . Cervical cancer Maternal Great-grandmother     Social History   Socioeconomic History  . Marital status: Single    Spouse name: Not on file  . Number of children: Not on file  . Years of education: Not on file  . Highest education level: Not on file  Occupational History  . Not on file  Tobacco Use  . Smoking status: Never Smoker  . Smokeless tobacco: Never Used  Substance and Sexual Activity  . Alcohol use: Never  . Drug use: Not Currently    Comment: UDS positive for MJ at [facility]  . Sexual activity: Yes    Birth control/protection: None  Other Topics Concern  . Not on file  Social History Narrative  .  Not on file   Social Determinants of Health   Financial Resource Strain:   . Difficulty of Paying Living Expenses: Not on file  Food Insecurity:   . Worried About Charity fundraiser in the Last Year: Not on file  . Ran Out of Food in the Last Year: Not on file  Transportation Needs:   . Lack of Transportation (Medical): Not on file  . Lack of Transportation (Non-Medical): Not on file  Physical Activity: Inactive  . Days of Exercise per Week: 0 days  . Minutes of Exercise per Session: 0 min  Stress:   . Feeling of Stress : Not on file  Social Connections:   . Frequency of Communication with Friends and Family: Not on file  . Frequency of Social Gatherings with Friends and Family: Not on file  . Attends Religious Services: Not on file  . Active Member of Clubs or Organizations: Not on file  . Attends Archivist Meetings: Not on file  . Marital Status: Not on file  Intimate Partner Violence:   . Fear of Current or Ex-Partner: Not on file  . Emotionally Abused: Not on file  . Physically Abused: Not on file  . Sexually Abused: Not on file    Allergies  Allergen Reactions  . Peanut-Containing Drug Products Swelling    Tongue swelling  Prior to Admission medications   Medication Sig Start Date End Date Taking? Authorizing Provider  ibuprofen (ADVIL) 600 MG tablet Take 1 tablet (600 mg total) by mouth every 6 (six) hours. 06/14/19   Vena Austria, MD  ondansetron (ZOFRAN ODT) 4 MG disintegrating tablet Take 1 tablet (4 mg total) by mouth every 6 (six) hours as needed for nausea. 06/14/19   Vena Austria, MD  oxyCODONE-acetaminophen (PERCOCET/ROXICET) 5-325 MG tablet Take 1 tablet by mouth every 6 (six) hours as needed for moderate pain (pain score 4-7/10). 06/14/19   Vena Austria, MD    Physical Exam Wt 154 lb (69.9 kg)   BMI 30.08 kg/m    General: NAD HEENT: normocephalic, anicteric Pulmonary: No increased work of breathing Extremities: no edema,  erythema, or tenderness Neurologic: Grossly intact, normal gait Psychiatric: mood appropriate, affect full  Imaging Report: US Transvaginal Non-OB [facility])  Result Date: 08/14/2019 Patient Name: [name] [name] DOB: [birth year] MRN: [id number] ULTRASOUND REPORT Location: [facility] Date of Service: 08/12/2019 Indications: f/u right pyosalpinx Findings: The uterus is anteverted and measures 8.2 x 5.3 x 4.4 cm. Echo texture is homogenous without evidence of focal masses. The Endometrium measures 7.4 mm. Right Ovary measures 3.0 x 3.8 x 2.0 cm. It is normal in appearance. There is a right pyosalpinx. 12.2 mm fallopian tube measurement was outer wall to outer wall. 4.3 mm. Measurement from right outer wall to right inner wall. There is 6.8 mm echogenic material throughout  the fallopian tube.  This is significantly reduced from prior. Left Ovary measures 3.2 x 2.4 x 2.0 cm. It is normal in appearance. The left fallopian tube is not seen. Survey of the adnexa demonstrates no adnexal masses. There is no free fluid in the cul de sac. Impression: 1. Right pyosalpinx. Appears much improved from prior. 2. Normal appearing uterus and ovaries. Deanna Artis, RT The ultrasound images and findings were reviewed by me and I agree with the above report. Thomasene Mohair, MD, [facility], [facility] 08/14/2019 1:53 PM       Assessment: 23 y.o. G1P1001  1. Right pyosalpinx   2. LGSIL on Pap smear of cervix      Plan: Problem List Items Addressed This Visit      Genitourinary   Right pyosalpinx - Primary    Other Visit Diagnoses    LGSIL on Pap smear of cervix         Right pyosalpinx much improved. No symptoms today. Will continue to monitor for symptoms.   Discussed abnormal pap smear results. She will make an appointment for follow up for colposcopy as she declined today due to being on her period.  20 minutes spent in face to face discussion with > 50% spent in  counseling,management, and coordination of care of her right pyosalpinx and LGSIL pap smear abnormality.   Thomasene Mohair, MD, [facility], [facility] 08/12/2019 1:58 PM

## 2019-08-14 ENCOUNTER — Encounter: Payer: Self-pay | Admitting: Obstetrics and Gynecology

## 2019-09-02 ENCOUNTER — Encounter: Payer: Self-pay | Admitting: Adult Health

## 2019-09-02 NOTE — Progress Notes (Addendum)
Patient: [name] [name], Female    DOB: [birth year], 23 y.o.   MRN: [id number] Visit Date: 09/02/2019  Today's Provider: Jairo Ben, FNP   Chief Complaint  Patient presents with  . New Patient (Initial Visit)   Subjective:    New Patient [name] [name] is a 24 y.o. female who presents today for health maintenance and establish care. She feels well. She reports exercising some keeping up with her daughter she reports.  . She reports she is sleeping well.   G1 P 1- denies any miscarriages. 70 year old daughter at home. Her child's father was unfaithful and they are not together currently she reports this was recently. She has been treated for gonorrhea and chlamydia on 06/14/2019.  She denies any current symptoms.  She has a follow-up with them on Friday, 09/06/2019.  She thinks she will have lab work done at that visit.  She declines lab work today.  She is aware that she can come back in and have lab work done at this office should she not have it done there.  Is will be placed. ----------------------------------------------------------------- Last Reported Pap- 06/12/2019 ( low grade squamous intraepithelial lesion)-[facility] - she will repeat and has an appointment on this abnormal.  Regular menses.  No birth control.   Treated for STI on 06/14/2019 GC she was treated in the ER. Non smoker  Breast exams and PAP done at [facility]. Will continue to be patient there as well.  Patient  denies any fever, body aches,chills, rash, chest pain, shortness of breath, nausea, vomiting, or diarrhea.   Mild anemia 11.7 at last lab work 06/14/2019 Tdap- 07/05/2018 Denies any other drug use. No alcohol use.   Review of Systems  Constitutional: Negative.   HENT: Negative.   Respiratory: Negative.   Cardiovascular: Negative.   Gastrointestinal: Negative.   Genitourinary: Negative.   Musculoskeletal: Negative.   Skin: Negative.   Neurological: Negative.   Hematological:  Negative.   Psychiatric/Behavioral: Negative.   All other systems reviewed and are negative.   Social History She  reports that she has never smoked. She has never used smokeless tobacco. She reports previous drug use. She reports that she does not drink alcohol. Social History   Socioeconomic History  . Marital status: Single    Spouse name: Not on file  . Number of children: Not on file  . Years of education: Not on file  . Highest education level: Not on file  Occupational History  . Not on file  Tobacco Use  . Smoking status: Never Smoker  . Smokeless tobacco: Never Used  Substance and Sexual Activity  . Alcohol use: Never  . Drug use: Not Currently    Comment: UDS positive for MJ at [facility]  . Sexual activity: Yes    Birth control/protection: None  Other Topics Concern  . Not on file  Social History Narrative  . Not on file   Social Determinants of Health   Financial Resource Strain:   . Difficulty of Paying Living Expenses: Not on file  Food Insecurity:   . Worried About Programme researcher, broadcasting/film/video in the Last Year: Not on file  . Ran Out of Food in the Last Year: Not on file  Transportation Needs:   . Lack of Transportation (Medical): Not on file  . Lack of Transportation (Non-Medical): Not on file  Physical Activity:   . Days of Exercise per Week: Not on file  . Minutes of  Exercise per Session: Not on file  Stress:   . Feeling of Stress : Not on file  Social Connections:   . Frequency of Communication with Friends and Family: Not on file  . Frequency of Social Gatherings with Friends and Family: Not on file  . Attends Religious Services: Not on file  . Active Member of Clubs or Organizations: Not on file  . Attends Banker Meetings: Not on file  . Marital Status: Not on file    Patient Active Problem List   Diagnosis Date Noted  . Right pyosalpinx 06/13/2019  . Normal vaginal delivery 08/20/2018  . Postpartum care following vaginal delivery  08/20/2018  . Indication for care in labor and delivery, antepartum 08/17/2018  . High-risk pregnancy, third trimester 07/12/2018  . Diet controlled gestational diabetes mellitus (GDM) in third trimester 06/26/2018  . History of substance use 06/12/2018  . Supervision of normal first pregnancy, antepartum 01/18/2018    Past Surgical History:  Procedure Laterality Date  . MOUTH SURGERY      Family History  Family Status  Relation Name Status  . MGM  (Not Specified)  . Other  (Not Specified)  . Maternal GGM  (Not Specified)   Her family history includes Cervical cancer in her maternal great-grandmother; Diabetes in an other family member; Hypertension in her maternal grandmother.     Allergies  Allergen Reactions  . Peanut-Containing Drug Products Swelling    Tongue swelling    Previous Medications   IBUPROFEN (ADVIL) 600 MG TABLET    Take 1 tablet (600 mg total) by mouth every 6 (six) hours.   ONDANSETRON (ZOFRAN ODT) 4 MG DISINTEGRATING TABLET    Take 1 tablet (4 mg total) by mouth every 6 (six) hours as needed for nausea.   OXYCODONE-ACETAMINOPHEN (PERCOCET/ROXICET) 5-325 MG TABLET    Take 1 tablet by mouth every 6 (six) hours as needed for moderate pain (pain score 4-7/10).   PRENATAL VIT-FE FUMARATE-FA (PRENATAL VITAMIN) 27-0.8 MG TABS    Take 1 tablet by mouth daily.    Patient Care Team: Flinchum, Eula Fried, FNP as PCP - General (Family Medicine)      Objective:   Vitals: There were no vitals taken for this visit.   Physical Exam Vitals reviewed.  Constitutional:      General: She is not in acute distress.    Appearance: She is well-developed. She is not diaphoretic.     Interventions: She is not intubated.    Comments: Patient is alert and oriented and responsive to questions Engages in eye contact with provider. Speaks in full sentences without any pauses without any shortness of breath or distress.    HENT:     Head: Normocephalic and atraumatic.      Right Ear: External ear normal.     Left Ear: External ear normal.     Nose: Nose normal.     Mouth/Throat:     Pharynx: No oropharyngeal exudate or posterior oropharyngeal erythema.  Eyes:     General: Lids are normal. No scleral icterus.       Right eye: No discharge.        Left eye: No discharge.     Conjunctiva/sclera: Conjunctivae normal.     Right eye: Right conjunctiva is not injected. No exudate or hemorrhage.    Left eye: Left conjunctiva is not injected. No exudate or hemorrhage.    Pupils: Pupils are equal, round, and reactive to light.  Neck:  Thyroid: No thyroid mass or thyromegaly.     Vascular: Normal carotid pulses. No carotid bruit, hepatojugular reflux or JVD.     Trachea: Trachea and phonation normal. No tracheal tenderness or tracheal deviation.     Meningeal: Brudzinski's sign and Kernig's sign absent.  Cardiovascular:     Rate and Rhythm: Normal rate and regular rhythm.     Pulses: Normal pulses.          Radial pulses are 2+ on the right side and 2+ on the left side.       Dorsalis pedis pulses are 2+ on the right side and 2+ on the left side.       Posterior tibial pulses are 2+ on the right side and 2+ on the left side.     Heart sounds: Normal heart sounds, S1 normal and S2 normal. Heart sounds not distant. No murmur. No friction rub. No gallop.   Pulmonary:     Effort: Pulmonary effort is normal. No tachypnea, bradypnea, accessory muscle usage or respiratory distress. She is not intubated.     Breath sounds: Normal breath sounds. No stridor. No wheezing or rales.  Chest:     Chest wall: No tenderness.  Abdominal:     General: Bowel sounds are normal. There is no distension or abdominal bruit.     Palpations: Abdomen is soft. There is no shifting dullness, fluid wave, hepatomegaly, splenomegaly, mass or pulsatile mass.     Tenderness: There is no abdominal tenderness. There is no right CVA tenderness, left CVA tenderness, guarding or rebound.      Hernia: No hernia is present.  Genitourinary:    Comments: deferred - [facility]  Musculoskeletal:        General: No tenderness or deformity. Normal range of motion.     Cervical back: Full passive range of motion without pain, normal range of motion and neck supple. No edema, erythema or rigidity. No spinous process tenderness or muscular tenderness. Normal range of motion.  Lymphadenopathy:     Head:     Right side of head: No submental, submandibular, tonsillar, preauricular, posterior auricular or occipital adenopathy.     Left side of head: No submental, submandibular, tonsillar, preauricular, posterior auricular or occipital adenopathy.     Cervical: No cervical adenopathy.     Right cervical: No superficial, deep or posterior cervical adenopathy.    Left cervical: No superficial, deep or posterior cervical adenopathy.     Upper Body:     Right upper body: No supraclavicular or pectoral adenopathy.     Left upper body: No supraclavicular or pectoral adenopathy.  Skin:    General: Skin is warm and dry.     Coloration: Skin is not jaundiced or pale.     Findings: No abrasion, bruising, burn, ecchymosis, erythema, lesion, petechiae or rash.     Nails: There is no clubbing.  Neurological:     General: No focal deficit present.     Mental Status: She is alert and oriented to person, place, and time.     GCS: GCS eye subscore is 4. GCS verbal subscore is 5. GCS motor subscore is 6.     Cranial Nerves: No cranial nerve deficit.     Sensory: No sensory deficit.     Motor: No tremor, atrophy, abnormal muscle tone or seizure activity.     Coordination: Coordination normal.     Gait: Gait normal.     Deep Tendon Reflexes: Reflexes are normal and symmetric.  Reflexes normal. Babinski sign absent on the right side. Babinski sign absent on the left side.     Reflex Scores:      Tricep reflexes are 2+ on the right side and 2+ on the left side.      Bicep reflexes are 2+ on the right side  and 2+ on the left side.      Brachioradialis reflexes are 2+ on the right side and 2+ on the left side.      Patellar reflexes are 2+ on the right side and 2+ on the left side.      Achilles reflexes are 2+ on the right side and 2+ on the left side.    Comments: Patient moves on and off of exam table and in room without difficulty. Gait is normal in hall and in room. Patient is oriented to person place time and situation. Patient answers questions appropriately and engages in conversation.   Psychiatric:        Mood and Affect: Mood normal.        Speech: Speech normal.        Behavior: Behavior normal.        Thought Content: Thought content normal.        Judgment: Judgment normal.      Depression Screen PHQ 2/9 Scores 07/02/2018  PHQ - 2 Score 0      Assessment & Plan:     Routine Health Maintenance and Physical Exam  Exercise Activities and Dietary recommendations Goals   None     Immunization History  Administered Date(s) Administered  . Tdap 07/05/2018    Health Maintenance  Topic Date Due  . INFLUENZA VACCINE  03/23/2019  . PAP-Cervical Cytology Screening  06/11/2022  . PAP SMEAR-Modifier  06/11/2022  . TETANUS/TDAP  07/05/2028  . HIV Screening  Completed    1. Screening for thyroid disorder Orders Placed This Encounter  Procedures  . CBC with Differential/Platelet [id number]  . Comprehensive Metabolic Panel (CMET)  . TSH  . Lipid Panel w/o Chol/HDL Ratio     2. Screening for lipid disorders Lipid panel   3. Hemoglobin decreased Cbc   Screening for thyroid disorder - Plan: TSH  Screening for lipid disorders - Plan: Lipid Panel w/o Chol/HDL Ratio  Hemoglobin decreased - Plan: CBC with Differential/Platelet [id number], Comprehensive Metabolic Panel (CMET)  History of gonorrhea-05/2019  History of chlamydia-05/2019    Discussed health benefits of physical activity, and encouraged her to engage in regular exercise appropriate for her age and  condition.    She is aware that I recommend labs to recheck hemoglobin and test off cure for [facility] after treatment as well as other labs ordered.  She thinks she will have these done at [facility] but will call if not.  Orders Placed This Encounter  Procedures  . CBC with Differential/Platelet [id number]  . Comprehensive Metabolic Panel (CMET)  . TSH  . Lipid Panel w/o Chol/HDL Ratio    The entirety of the information documented in the History of Present Illness, Review of Systems and Physical Exam were personally obtained by me. Portions of this information were initially documented by the  Certified Medical Assistant whose name is documented in [place] and reviewed by me for thoroughness and accuracy.  I have personally performed the exam and reviewed the chart and it is accurate to the best of my knowledge.  Haematologist has been used and any errors in dictation or transcription are unintentional.  Kelby Aline. Flinchum FNP-C  [facility]  --------------------------------------------------------------------

## 2019-09-03 ENCOUNTER — Other Ambulatory Visit: Payer: Self-pay

## 2019-09-03 ENCOUNTER — Encounter: Payer: Self-pay | Admitting: Adult Health

## 2019-09-03 ENCOUNTER — Ambulatory Visit: Payer: Medicaid Other | Admitting: Adult Health

## 2019-09-03 VITALS — BP 124/84 | HR 93 | Temp 97.1°F | Resp 16 | Ht 60.0 in | Wt 152.4 lb

## 2019-09-03 DIAGNOSIS — Z13220 Encounter for screening for lipoid disorders: Secondary | ICD-10-CM

## 2019-09-03 DIAGNOSIS — Z8619 Personal history of other infectious and parasitic diseases: Secondary | ICD-10-CM

## 2019-09-03 DIAGNOSIS — R710 Precipitous drop in hematocrit: Secondary | ICD-10-CM | POA: Diagnosis not present

## 2019-09-03 DIAGNOSIS — Z1329 Encounter for screening for other suspected endocrine disorder: Secondary | ICD-10-CM

## 2019-09-03 NOTE — Patient Instructions (Signed)
Health Maintenance, Female Adopting a healthy lifestyle and getting preventive care are important in promoting health and wellness. Ask your health care provider about:  The right schedule for you to have regular tests and exams.  Things you can do on your own to prevent diseases and keep yourself healthy. What should I know about diet, weight, and exercise? Eat a healthy diet   Eat a diet that includes plenty of vegetables, fruits, low-fat dairy products, and lean protein.  Do not eat a lot of foods that are high in solid fats, added sugars, or sodium. Maintain a healthy weight Body mass index (BMI) is used to identify weight problems. It estimates body fat based on height and weight. Your health care provider can help determine your BMI and help you achieve or maintain a healthy weight. Get regular exercise Get regular exercise. This is one of the most important things you can do for your health. Most adults should:  Exercise for at least 150 minutes each week. The exercise should increase your heart rate and make you sweat (moderate-intensity exercise).  Do strengthening exercises at least twice a week. This is in addition to the moderate-intensity exercise.  Spend less time sitting. Even light physical activity can be beneficial. Watch cholesterol and blood lipids Have your blood tested for lipids and cholesterol at 26 years of age, then have this test every 5 years. Have your cholesterol levels checked more often if:  Your lipid or cholesterol levels are high.  You are older than 26 years of age.  You are at high risk for heart disease. What should I know about cancer screening? Depending on your health history and family history, you may need to have cancer screening at various ages. This may include screening for:  Breast cancer.  Cervical cancer.  Colorectal cancer.  Skin cancer.  Lung cancer. What should I know about heart disease, diabetes, and high blood  pressure? Blood pressure and heart disease  High blood pressure causes heart disease and increases the risk of stroke. This is more likely to develop in people who have high blood pressure readings, are of African descent, or are overweight.  Have your blood pressure checked: ? Every 3-5 years if you are 18-39 years of age. ? Every year if you are 40 years old or older. Diabetes Have regular diabetes screenings. This checks your fasting blood sugar level. Have the screening done:  Once every three years after age 40 if you are at a normal weight and have a low risk for diabetes.  More often and at a younger age if you are overweight or have a high risk for diabetes. What should I know about preventing infection? Hepatitis B If you have a higher risk for hepatitis B, you should be screened for this virus. Talk with your health care provider to find out if you are at risk for hepatitis B infection. Hepatitis C Testing is recommended for:  Everyone born from 1945 through 1965.  Anyone with known risk factors for hepatitis C. Sexually transmitted infections (STIs)  Get screened for STIs, including gonorrhea and chlamydia, if: ? You are sexually active and are younger than 26 years of age. ? You are older than 26 years of age and your health care provider tells you that you are at risk for this type of infection. ? Your sexual activity has changed since you were last screened, and you are at increased risk for chlamydia or gonorrhea. Ask your health care provider if   you are at risk.  Ask your health care provider about whether you are at high risk for HIV. Your health care provider may recommend a prescription medicine to help prevent HIV infection. If you choose to take medicine to prevent HIV, you should first get tested for HIV. You should then be tested every 3 months for as long as you are taking the medicine. Pregnancy  If you are about to stop having your period (premenopausal) and  you may become pregnant, seek counseling before you get pregnant.  Take 400 to 800 micrograms (mcg) of folic acid every day if you become pregnant.  Ask for birth control (contraception) if you want to prevent pregnancy. Osteoporosis and menopause Osteoporosis is a disease in which the bones lose minerals and strength with aging. This can result in bone fractures. If you are 65 years old or older, or if you are at risk for osteoporosis and fractures, ask your health care provider if you should:  Be screened for bone loss.  Take a calcium or vitamin D supplement to lower your risk of fractures.  Be given hormone replacement therapy (HRT) to treat symptoms of menopause. Follow these instructions at home: Lifestyle  Do not use any products that contain nicotine or tobacco, such as cigarettes, e-cigarettes, and chewing tobacco. If you need help quitting, ask your health care provider.  Do not use street drugs.  Do not share needles.  Ask your health care provider for help if you need support or information about quitting drugs. Alcohol use  Do not drink alcohol if: ? Your health care provider tells you not to drink. ? You are pregnant, may be pregnant, or are planning to become pregnant.  If you drink alcohol: ? Limit how much you use to 0-1 drink a day. ? Limit intake if you are breastfeeding.  Be aware of how much alcohol is in your drink. In the [place] one drink equals one 12 oz bottle of beer (355 mL), one 5 oz glass of wine (148 mL), or one 1 oz glass of hard liquor (44 mL). General instructions  Schedule regular health, dental, and eye exams.  Stay current with your vaccines.  Tell your health care provider if: ? You often feel depressed. ? You have ever been abused or do not feel safe at home. Summary  Adopting a healthy lifestyle and getting preventive care are important in promoting health and wellness.  Follow your health care provider's instructions about healthy  diet, exercising, and getting tested or screened for diseases.  Follow your health care provider's instructions on monitoring your cholesterol and blood pressure. This information is not intended to replace advice given to you by your health care provider. Make sure you discuss any questions you have with your health care provider. Document Revised: 08/01/2018 Document Reviewed: 08/01/2018 Elsevier Patient Education  2020 Elsevier Inc.  

## 2019-09-06 ENCOUNTER — Ambulatory Visit: Payer: Medicaid Other | Admitting: Obstetrics and Gynecology

## 2019-10-02 ENCOUNTER — Ambulatory Visit: Payer: Medicaid Other | Admitting: Obstetrics and Gynecology

## 2019-10-24 ENCOUNTER — Encounter: Payer: Self-pay | Admitting: Obstetrics and Gynecology

## 2019-10-24 ENCOUNTER — Ambulatory Visit (INDEPENDENT_AMBULATORY_CARE_PROVIDER_SITE_OTHER): Payer: Medicaid Other | Admitting: Obstetrics and Gynecology

## 2019-10-24 ENCOUNTER — Other Ambulatory Visit (HOSPITAL_COMMUNITY)
Admission: RE | Admit: 2019-10-24 | Discharge: 2019-10-24 | Disposition: A | Discharge status: Home / Self Care | Payer: Medicaid Other | Source: Ambulatory Visit | Attending: Obstetrics and Gynecology | Admitting: Obstetrics and Gynecology

## 2019-10-24 ENCOUNTER — Other Ambulatory Visit: Payer: Self-pay

## 2019-10-24 VITALS — BP 122/70 | Ht 60.0 in | Wt 151.0 lb

## 2019-10-24 DIAGNOSIS — N871 Moderate cervical dysplasia: Secondary | ICD-10-CM

## 2019-10-24 DIAGNOSIS — N870 Mild cervical dysplasia: Secondary | ICD-10-CM | POA: Diagnosis not present

## 2019-10-24 DIAGNOSIS — R87612 Low grade squamous intraepithelial lesion on cytologic smear of cervix (LGSIL): Secondary | ICD-10-CM | POA: Insufficient documentation

## 2019-10-24 NOTE — Progress Notes (Signed)
0HPI:  [name] [name] is a 24 y.o.  G1P1001  who presents today for evaluation and management of abnormal cervical cytology.    Dysplasia History:  LGSIL  OB History  Gravida Para Term Preterm AB Living  1 1 1     1   SAB TAB Ectopic Multiple Live Births        0 1    # Outcome Date GA Lbr Len/2nd Weight Sex Delivery Anes PTL Lv  1 Term 08/19/18 [redacted]w[redacted]d 16:57 / 00:35 6 lb 4.2 oz (2.84 kg) F Vag-Spont EPI  LIV    Past Medical History:  Diagnosis Date  . Allergy   . Gestational diabetes     Past Surgical History:  Procedure Laterality Date  . MOUTH SURGERY      SOCIAL HISTORY:  Social History   Substance and Sexual Activity  Alcohol Use Never    Social History   Substance and Sexual Activity  Drug Use Not Currently   Comment: UDS positive for MJ at [facility]     Family History  Problem Relation Age of Onset  . Hypertension Maternal Grandmother   . Diabetes Other        maternal great aunts and uncles  . Cervical cancer Maternal Great-grandmother     ALLERGIES:  Peanut-containing drug products  No current outpatient medications on file prior to visit.   No current facility-administered medications on file prior to visit.    Physical Exam: -Vitals:  BP 122/70   Ht 5' (1.524 m)   Wt 151 lb (68.5 kg)   LMP 10/05/2019   BMI 29.49 kg/m  GEN: WD, WN, NAD.  A+ O x 3, good mood and affect. ABD:  NT, ND.  Soft, no masses.  No hernias noted.   Pelvic:   Vulva: Normal appearance.  No lesions.  Vagina: No lesions or abnormalities noted.  Support: Normal pelvic support.  Urethra No masses tenderness or scarring.  Meatus Normal size without lesions or prolapse.  Cervix: See below.  Anus: Normal exam.  No lesions.  Perineum: Normal exam.  No lesions.        Bimanual   Uterus: Normal size.  Non-tender.  Mobile.  AV.  Adnexae: No masses.  Non-tender to palpation.  Cul-de-sac: Negative for abnormality.   PROCEDURE: 1.  Urine Pregnancy Test:  negative 2.   Colposcopy performed with 4% acetic acid after verbal consent obtained                                         -Aceto-white Lesions Location(s): diffusely posteriorly and anteriorly.              -Biopsy performed at 5, 7, 12 o'clock               -ECC indicated and performed: Yes.       -Biopsy sites made hemostatic with pressure, AgNO3, and/or Monsel's solution   -Satisfactory colposcopy: No.    -Evidence of Invasive cervical CA :  NO  ASSESSMENT:  [name] [name] is a 24 y.o. G1P1001 here for  1. LGSIL on Pap smear of cervix   .  PLAN:  I discussed the grading system of pap smears and HPV high risk viral types.  We will discuss and base management after colpo results return.       Prentice Docker, MD  [facility] 10/24/2019  10:06 AM

## 2019-10-28 LAB — SURGICAL PATHOLOGY

## 2019-11-12 ENCOUNTER — Telehealth: Payer: Self-pay | Admitting: Obstetrics and Gynecology

## 2019-11-12 NOTE — Telephone Encounter (Signed)
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  Recommend LEEP per ASCCP guidelines.  Did discuss following Q6 months with cytology and colpo. Discussed theoretical risks related to pregnancy and cervical insufficiency.  Patient elects LEEP procedure. Will schedule.

## 2019-11-13 ENCOUNTER — Telehealth: Payer: Self-pay | Admitting: Obstetrics and Gynecology

## 2019-11-13 NOTE — Telephone Encounter (Signed)
Called pt to sch IN OFFICE procedure  DOS 4/30 @ 8:30 - In office LEEP  Adv pt to ar @ 8:15

## 2019-11-13 NOTE — Telephone Encounter (Signed)
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

## 2019-12-18 ENCOUNTER — Telehealth: Payer: Self-pay | Admitting: Obstetrics and Gynecology

## 2019-12-18 NOTE — Telephone Encounter (Signed)
Pt called to reschedule the LEEP w Jean Rosenthal on 4/30. She has a scheduling conflict. Rescheduled LEEP for 5/14.

## 2019-12-18 NOTE — Telephone Encounter (Signed)
Noted  

## 2019-12-20 ENCOUNTER — Ambulatory Visit: Payer: Medicaid Other | Admitting: Obstetrics and Gynecology

## 2020-01-03 ENCOUNTER — Encounter: Payer: Self-pay | Admitting: Obstetrics and Gynecology

## 2020-01-03 ENCOUNTER — Other Ambulatory Visit: Payer: Self-pay

## 2020-01-03 ENCOUNTER — Ambulatory Visit (INDEPENDENT_AMBULATORY_CARE_PROVIDER_SITE_OTHER): Payer: Medicaid Other | Admitting: Obstetrics and Gynecology

## 2020-01-03 ENCOUNTER — Other Ambulatory Visit (HOSPITAL_COMMUNITY)
Admission: RE | Admit: 2020-01-03 | Discharge: 2020-01-03 | Disposition: A | Discharge status: Home / Self Care | Payer: Medicaid Other | Source: Ambulatory Visit | Attending: Obstetrics and Gynecology | Admitting: Obstetrics and Gynecology

## 2020-01-03 VITALS — BP 122/74 | Wt 155.0 lb

## 2020-01-03 DIAGNOSIS — N871 Moderate cervical dysplasia: Secondary | ICD-10-CM

## 2020-01-03 NOTE — Progress Notes (Addendum)
  LEEP PROCEDURE NOTE  The LEEP has been explained to the patient in detail; risks/benefits reviewed.  The risks include, but are not limited to, bleeding, infection, and the possibility of cervical stenosis or cervical incompetence.  The patient had previously been given information regarding abnormal PAP smears and their relationship to HPV.  We have discussed the natural course and history of HPV, the possibility of incomplete treatment by the LEEP, as well as the possibility of recurrence.  I have reviewed the consent form for LEEP with her, and she fully understands its contents.  We have discussed the procedure itself. I have informed her that following the LEEP she should refrain from intercourse and the use of tampons for three weeks, and that she should also expect some spotting and brown/black discharge over the next several days.  We have discussed the fact that vaginal bleeding, differentiated from spotting, is not normal and that if she should have this complication, she should contact me immediately.  The follow-up after LEEP will be PAP smears or viral typing performed at regular intervals for up to 3-5 years.  Should these all prove to be normal, she will then be back on typical cervical screening.  I have answered all of her questions, and I believe she has an adequate understanding of the LEEP, its implications, and the necessity of follow-up care.  I discussed her colpo results and explained the procedure of LEEP.  All questions were answered and she signed the consent form.    The patient was placed in dorsal supine lithotomy position. The insulated speculum was placed in her vagina.  Using colposcopy throughout the procedure to guide identification of margins after staining with Lugol's solution, a LEEP performed in the usual manner after reviewing the previous colpo findings and results. Lugol's solution was usesd to identify any abnormal areas of the cervix.  The cervix was cleansed  with betadine solution. Local injection of Lidocaine was performed for anesthesia. Ectocervical and then endocervical specimens obtained using the loop electrodes without difficulty.  It was labeled accordingly. The base and edges of the defect was then cauterized using coagulation current.  Monsel's solution applied to maintain hemostasis.  The speculum was removed.   The patient tolerated the procedure well.   Thomasene Mohair, MD 01/03/2020 5:47 PM

## 2020-01-07 LAB — SURGICAL PATHOLOGY

## 2020-01-07 NOTE — Addendum Note (Signed)
Addended by: Liliane Shi on: 01/07/2020 10:20 AM   Modules accepted: Orders

## 2020-01-09 ENCOUNTER — Other Ambulatory Visit: Payer: Self-pay

## 2020-01-09 ENCOUNTER — Telehealth: Payer: Self-pay | Admitting: Obstetrics and Gynecology

## 2020-01-09 ENCOUNTER — Ambulatory Visit: Payer: Medicaid Other | Admitting: Family Medicine

## 2020-01-09 ENCOUNTER — Encounter: Payer: Self-pay | Admitting: Family Medicine

## 2020-01-09 ENCOUNTER — Other Ambulatory Visit (HOSPITAL_COMMUNITY)
Admission: RE | Admit: 2020-01-09 | Discharge: 2020-01-09 | Disposition: A | Discharge status: Home / Self Care | Payer: Medicaid Other | Source: Ambulatory Visit | Attending: Family Medicine | Admitting: Family Medicine

## 2020-01-09 VITALS — BP 120/76 | HR 101 | Temp 97.1°F | Resp 16 | Wt 149.0 lb

## 2020-01-09 DIAGNOSIS — R102 Pelvic and perineal pain: Secondary | ICD-10-CM | POA: Diagnosis not present

## 2020-01-09 MED ORDER — MELOXICAM 15 MG PO TABS: 15.0000 mg | ORAL_TABLET | Freq: Every day | ORAL | 0 refills | Status: DC

## 2020-01-09 NOTE — Telephone Encounter (Signed)
Patient is returning missed call from SDJ . Please advise °

## 2020-01-09 NOTE — Progress Notes (Signed)
Established patient visit   Patient: [name] [name]   DOB: 22-Dec-1995   24 y.o. Female  MRN: [id number] Visit Date: 01/09/2020  Today's healthcare provider: Lavon Paganini, MD   Chief Complaint  Patient presents with  . Abdominal Pain    x 6 days   Subjective    Abdominal Pain This is a new problem. Episode onset: 6 days ago; started after having LEEP Procedure done by GYN. The problem occurs intermittently. The problem has been unchanged. The pain is located in the RLQ. Associated symptoms include nausea and vomiting. Pertinent negatives include no fever. Exacerbated by: prolonged sitting or laying down. The pain is relieved by nothing. She has tried acetaminophen for the symptoms. The treatment provided mild relief.   Was told to expect mild cramping after LEEP Pain started 3 days after LEEP.  Some vomiting due to pain.  NBNB  Minimal vaginal bleeding.  No new discharge. Has not talked to GYN about pain  Has h/o R pyosalpinx/PID ~6 months ago  Social History   Tobacco Use  . Smoking status: Never Smoker  . Smokeless tobacco: Never Used  Substance Use Topics  . Alcohol use: Never  . Drug use: Not Currently    Comment: UDS positive for MJ at [facility]       Medications: No outpatient medications prior to visit.   No facility-administered medications prior to visit.    Review of Systems  Constitutional: Negative for appetite change, chills, fatigue and fever.  Respiratory: Negative for chest tightness and shortness of breath.   Cardiovascular: Negative for chest pain and palpitations.  Gastrointestinal: Positive for abdominal pain (right lower quadrant), nausea and vomiting.  Genitourinary: Negative for vaginal bleeding.  Neurological: Negative for dizziness and weakness.       Objective    BP 120/76 (BP Location: Left Arm, Patient Position: Sitting)   Pulse (!) 101   Temp (!) 97.1 F (36.2 C) (Temporal)   Resp 16   Wt 149 lb (67.6 kg)   LMP 12/30/2019    BMI 29.10 kg/m     Physical Exam Vitals reviewed.  Constitutional:      General: She is not in acute distress.    Appearance: Normal appearance. She is well-developed. She is not diaphoretic.  HENT:     Head: Normocephalic and atraumatic.  Eyes:     General: No scleral icterus.    Conjunctiva/sclera: Conjunctivae normal.  Neck:     Thyroid: No thyromegaly.  Cardiovascular:     Rate and Rhythm: Normal rate and regular rhythm.     Pulses: Normal pulses.     Heart sounds: Normal heart sounds. No murmur.  Pulmonary:     Effort: Pulmonary effort is normal. No respiratory distress.     Breath sounds: Normal breath sounds. No wheezing, rhonchi or rales.  Abdominal:     General: Abdomen is flat. Bowel sounds are normal. There is no distension.     Palpations: Abdomen is soft. There is no mass.     Tenderness: There is abdominal tenderness in the right lower quadrant and suprapubic area. There is no right CVA tenderness, left CVA tenderness, guarding or rebound. Negative signs include McBurney's sign.  Genitourinary:    Vagina: Normal.     Adnexa:        Right: Tenderness present. No mass or fullness.         Left: No mass, tenderness or fullness.       Comments: Cervix s/p LEEP  with hemostatic agent in place. Minimal grey discharge, no bleeding. No cervical motion tenderness. R adenexal TTP Musculoskeletal:     Cervical back: Neck supple.     Right lower leg: No edema.     Left lower leg: No edema.  Lymphadenopathy:     Cervical: No cervical adenopathy.  Skin:    General: Skin is warm and dry.     Findings: No rash.  Neurological:     Mental Status: She is alert and oriented to person, place, and time. Mental status is at baseline.  Psychiatric:        Mood and Affect: Mood normal.        Behavior: Behavior normal.       No results found for any visits on 01/09/20.  Assessment & Plan     1. Right adnexal tenderness - unclear etiology, but concern for PID - new problem  s/p LEEP - has h/o R pyosalpinx/PID - will send STD testing and obtain pelvic US - will avoid TVUS given risk of bleeding after LEEP - spoke to Dr Jean Rosenthal who performed LEEP - She will f/u in his office soon - mobic prn for pain - return precautions discussed - Cervicovaginal ancillary only - US PELVIS (TRANSABDOMINAL ONLY); Future   Return if symptoms worsen or fail to improve.     I, Shirlee Latch, MD, have reviewed all documentation for this visit. The documentation on 01/09/20 for the exam, diagnosis, procedures, and orders are all accurate and complete.   Harles Evetts, Marzella Schlein, MD, MPH [facility]

## 2020-01-09 NOTE — Patient Instructions (Signed)
Pelvic Pain, Female Pelvic pain is pain in your lower belly (abdomen), below your belly button and between your hips. The pain may start suddenly (be acute), keep coming back (be recurring), or last a long time (become chronic). Pelvic pain that lasts longer than 6 months is called chronic pelvic pain. There are many causes of pelvic pain. Sometimes the cause of pelvic pain is not known. Follow these instructions at home:   Take over-the-counter and prescription medicines only as told by your doctor.  Rest as told by your doctor.  Do not have sex if it hurts.  Keep a journal of your pelvic pain. Write down: ? When the pain started. ? Where the pain is located. ? What seems to make the pain better or worse, such as food or your period (menstrual cycle). ? Any symptoms you have along with the pain.  Keep all follow-up visits as told by your doctor. This is important. Contact a doctor if:  Medicine does not help your pain.  Your pain comes back.  You have new symptoms.  You have unusual discharge or bleeding from your vagina.  You have a fever or chills.  You are having trouble pooping (constipation).  You have blood in your pee (urine) or poop (stool).  Your pee smells bad.  You feel weak or light-headed. Get help right away if:  You have sudden pain that is very bad.  Your pain keeps getting worse.  You have very bad pain and also have any of these symptoms: ? A fever. ? Feeling sick to your stomach (nausea). ? Throwing up (vomiting). ? Being very sweaty.  You pass out (lose consciousness). Summary  Pelvic pain is pain in your lower belly (abdomen), below your belly button and between your hips.  There are many possible causes of pelvic pain.  Keep a journal of your pelvic pain. This information is not intended to replace advice given to you by your health care provider. Make sure you discuss any questions you have with your health care provider. Document  Revised: 01/24/2018 Document Reviewed: 01/24/2018 Elsevier Patient Education  2020 [employer]vier Inc[employer]  

## 2020-01-11 LAB — NUSWAB VAGINITIS PLUS (VG+)
BVAB 2: HIGH Score — AB
Candida albicans, NAA: NEGATIVE
Candida glabrata, NAA: NEGATIVE
Chlamydia trachomatis, NAA: NEGATIVE
Megasphaera 1: HIGH Score — AB
Neisseria gonorrhoeae, NAA: NEGATIVE
Trich vag by NAA: NEGATIVE

## 2020-01-13 ENCOUNTER — Telehealth: Payer: Self-pay

## 2020-01-13 LAB — CERVICOVAGINAL ANCILLARY ONLY
Bacterial Vaginitis (gardnerella): POSITIVE — AB
Candida Glabrata: NEGATIVE
Candida Vaginitis: NEGATIVE
Chlamydia: NEGATIVE
Comment: NEGATIVE
Comment: NEGATIVE
Comment: NEGATIVE
Comment: NEGATIVE
Comment: NEGATIVE
Comment: NORMAL
Neisseria Gonorrhea: NEGATIVE
Trichomonas: NEGATIVE

## 2020-01-13 MED ORDER — METRONIDAZOLE 500 MG PO TABS
500.0000 mg | ORAL_TABLET | Freq: Two times a day (BID) | ORAL | 0 refills | Status: DC
Start: 2020-01-13 — End: 2020-09-10

## 2020-01-13 NOTE — Telephone Encounter (Signed)
-----   Message from Erasmo Downer, MD sent at 01/13/2020 12:53 PM EDT ----- Positive for BV, no STD.  Start Metronidazole 500mg  BID x7d #14 r0.  No alcohol while taking this antibiotic.

## 2020-01-13 NOTE — Telephone Encounter (Signed)
Pt advised.  RX sent to [facility] in [place].   Thanks,   -Vernona Rieger

## 2020-01-16 ENCOUNTER — Ambulatory Visit: Payer: Medicaid Other | Attending: Family Medicine

## 2020-01-16 NOTE — Telephone Encounter (Signed)
Attempted to call. No answer. Voicemail not set up.

## 2020-02-03 ENCOUNTER — Ambulatory Visit: Payer: Medicaid Other | Admitting: Obstetrics and Gynecology

## 2020-02-07 ENCOUNTER — Encounter: Payer: Self-pay | Admitting: Obstetrics and Gynecology

## 2020-02-07 ENCOUNTER — Other Ambulatory Visit: Payer: Self-pay

## 2020-02-07 ENCOUNTER — Ambulatory Visit (INDEPENDENT_AMBULATORY_CARE_PROVIDER_SITE_OTHER): Payer: Medicaid Other | Admitting: Obstetrics and Gynecology

## 2020-02-07 VITALS — BP 122/74 | Ht 60.0 in | Wt 154.0 lb

## 2020-02-07 DIAGNOSIS — N871 Moderate cervical dysplasia: Secondary | ICD-10-CM

## 2020-02-07 NOTE — Progress Notes (Signed)
   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  Subjective: Eating a regular diet without difficulty. The patient is not having any pain.  Activity: normal activities of daily living.  Denies fevers, chills. She has no issues with bowel and bladder habits.   Objective: Vitals:   02/07/20 1321  BP: 122/74   Vital Signs: BP 122/74   Ht 5' (1.524 m)   Wt 154 lb (69.9 kg)   LMP 02/03/2020   BMI 30.08 kg/m  Constitutional: Well nourished, well developed female in no acute distress.  HEENT: normal Skin: Warm and dry.  Extremity: no edema  Abdomen: Soft, non-tender, normal bowel sounds; no bruits, organomegaly or masses.   Pelvic exam: (female chaperone present) is not limited by body habitus EGBUS: within normal limits Vagina: within normal limits and with normal mucosa blood in the vault Cervix: well healed, no lesions, no bleeding. No cmt. Bimanual is confirmatory    Assessment: 24 y.o. s/p LEEP progressing well  Plan: Patient has done well after surgery with no apparent complications.  I have discussed the post-operative course to date, and the expected progress moving forward.  The patient understands what complications to be concerned about.  I will see the patient in routine follow up, or sooner if needed.    Activity plan: No restriction.  Follow up 1 year for pap smear.   Thomasene Mohair., MD 02/07/2020, 1:34 PM

## 2020-09-08 ENCOUNTER — Ambulatory Visit: Payer: Medicaid Other | Admitting: Adult Health

## 2020-09-10 ENCOUNTER — Ambulatory Visit (INDEPENDENT_AMBULATORY_CARE_PROVIDER_SITE_OTHER): Payer: Medicaid Other | Admitting: Adult Health

## 2020-09-10 ENCOUNTER — Encounter: Payer: Self-pay | Admitting: Adult Health

## 2020-09-10 VITALS — BP 123/72 | HR 83 | Wt 151.8 lb

## 2020-09-10 DIAGNOSIS — Z202 Contact with and (suspected) exposure to infections with a predominantly sexual mode of transmission: Secondary | ICD-10-CM | POA: Diagnosis not present

## 2020-09-10 DIAGNOSIS — Z711 Person with feared health complaint in whom no diagnosis is made: Secondary | ICD-10-CM

## 2020-09-10 DIAGNOSIS — Z6829 Body mass index (BMI) 29.0-29.9, adult: Secondary | ICD-10-CM

## 2020-09-10 NOTE — Progress Notes (Signed)
Established patient visit   Patient: [name] [name]   DOB: 07/06/1996   25 y.o. Female  MRN: [id number] Visit Date: 09/10/2020  Today's healthcare provider: Jairo Ben, FNP   Chief Complaint  Patient presents with  . Exposure to STD   Subjective    Exposure to STD  The patient's pertinent negatives include no discharge, dyspareunia, dysuria, genital itching, genital lesions, genital rash or pelvic pain. This is a new (patient found out partner had cheated) problem. The vaginal discharge was normal. Pertinent negatives include no abdominal pain, anorexia, diaphoresis, fever, genital odor, rectal pain, sore throat or urinary frequency.    Partner,she was told by another female has been unfaithful and she should get tested for sexually transmitted diseased. She denies any abnormal vaginal symptoms, pelvic pain or abdominal pain. She reports she is completely asymptomatic just wants to be tested.   Patient's last menstrual period was 08/30/2020 (exact date).  Patient  denies any fever, body aches,chills, rash, chest pain, shortness of breath, nausea, vomiting, or diarrhea.  Denies dizziness, lightheadedness, pre syncopal or syncopal episodes.    Patient Active Problem List   Diagnosis Date Noted  . Right pyosalpinx 06/13/2019  . Normal vaginal delivery 08/20/2018  . Postpartum care following vaginal delivery 08/20/2018  . Indication for care in labor and delivery, antepartum 08/17/2018  . High-risk pregnancy, third trimester 07/12/2018  . Diet controlled gestational diabetes mellitus (GDM) in third trimester 06/26/2018  . History of substance use 06/12/2018  . Supervision of normal first pregnancy, antepartum 01/18/2018   Past Medical History:  Diagnosis Date  . Allergy   . Gestational diabetes    Allergies  Allergen Reactions  . Peanut-Containing Drug Products Swelling    Tongue swelling       Medications: Outpatient Medications Prior to Visit   Medication Sig  . [DISCONTINUED] meloxicam (MOBIC) 15 MG tablet Take 1 tablet (15 mg total) by mouth daily.  . [DISCONTINUED] metroNIDAZOLE (FLAGYL) 500 MG tablet Take 1 tablet (500 mg total) by mouth 2 (two) times daily.   No facility-administered medications prior to visit.    Review of Systems  Constitutional: Negative.  Negative for diaphoresis and fever.  HENT: Negative.  Negative for sore throat.   Respiratory: Negative.   Cardiovascular: Negative.   Gastrointestinal: Negative.  Negative for abdominal pain, anorexia and rectal pain.  Genitourinary: Negative.  Negative for dyspareunia, dysuria, frequency and pelvic pain.  Musculoskeletal: Negative.   Skin: Negative.   Neurological: Negative.   Hematological: Negative.   Psychiatric/Behavioral: Negative.     Last CBC Lab Results  Component Value Date   WBC 11.7 (H) 06/14/2019   HGB 11.2 (L) 06/14/2019   HCT 34.9 (L) 06/14/2019   MCV 86.4 06/14/2019   MCH 27.7 06/14/2019   RDW 12.8 06/14/2019   PLT 309 06/14/2019   Last metabolic panel Lab Results  Component Value Date   GLUCOSE 112 (H) 06/13/2019   NA 140 06/13/2019   K 3.5 06/13/2019   CL 106 06/13/2019   CO2 24 06/13/2019   BUN 12 06/13/2019   CREATININE 0.84 06/13/2019   GFRNONAA >60 06/13/2019   GFRAA >60 06/13/2019   CALCIUM 9.0 06/13/2019   PROT 8.1 06/13/2019   ALBUMIN 3.9 06/13/2019   BILITOT 0.4 06/13/2019   ALKPHOS 82 06/13/2019   AST 12 (L) 06/13/2019   ALT 11 06/13/2019   ANIONGAP 10 06/13/2019   Last lipids No results found for: CHOL, HDL, LDLCALC, LDLDIRECT, TRIG, CHOLHDL  Last hemoglobin A1c No results found for: HGBA1C Last thyroid functions No results found for: TSH, T3TOTAL, T4TOTAL, THYROIDAB Last vitamin D No results found for: 25OHVITD2, 25OHVITD3, VD25OH Last vitamin B12 and Folate No results found for: VITAMINB12, FOLATE     Objective    BP 123/72   Pulse 83   Wt 151 lb 12.8 oz (68.9 kg)   LMP 08/30/2020 (Exact Date)    SpO2 98%   BMI 29.65 kg/m  BP Readings from Last 3 Encounters:  09/10/20 123/72  02/07/20 122/74  01/09/20 120/76   Wt Readings from Last 3 Encounters:  09/10/20 151 lb 12.8 oz (68.9 kg)  02/07/20 154 lb (69.9 kg)  01/09/20 149 lb (67.6 kg)       Physical Exam Vitals and nursing note reviewed. Exam conducted with a chaperone present.  Constitutional:      General: She is not in acute distress.    Appearance: She is well-developed. She is not diaphoretic.     Interventions: She is not intubated.    Comments: Patient appers well, not sickly. Speaking in complete sentences. Patient moves on and off of exam table and in room without difficulty. Gait is normal in hall and in room. Patient is oriented to person place time and situation. Patient answers questions appropriately and engages eye contact and verbal dialect with provider.   HENT:     Head: Normocephalic and atraumatic.     Right Ear: External ear normal.     Left Ear: External ear normal.     Nose: Nose normal.     Mouth/Throat:     Mouth: Mucous membranes are moist.     Pharynx: Oropharynx is clear.  Eyes:     General: Lids are normal.     Conjunctiva/sclera: Conjunctivae normal.     Right eye: Right conjunctiva is not injected. No exudate or hemorrhage.    Left eye: Left conjunctiva is not injected. No exudate or hemorrhage.    Pupils: Pupils are equal, round, and reactive to light.  Neck:     Thyroid: No thyroid mass or thyromegaly.     Vascular: Normal carotid pulses. No carotid bruit, hepatojugular reflux or JVD.     Trachea: Trachea and phonation normal. No tracheal tenderness or tracheal deviation.     Meningeal: Brudzinski's sign and Kernig's sign absent.  Cardiovascular:     Rate and Rhythm: Normal rate and regular rhythm.     Pulses: Normal pulses.          Radial pulses are 2+ on the right side and 2+ on the left side.       Dorsalis pedis pulses are 2+ on the right side and 2+ on the left side.        Posterior tibial pulses are 2+ on the right side and 2+ on the left side.     Heart sounds: Normal heart sounds, S1 normal and S2 normal. Heart sounds not distant. No murmur heard. No friction rub. No gallop.   Pulmonary:     Effort: Pulmonary effort is normal. No tachypnea, bradypnea, accessory muscle usage or respiratory distress. She is not intubated.     Breath sounds: Normal breath sounds. No stridor. No wheezing, rhonchi or rales.  Chest:     Chest wall: No tenderness.  Breasts:     Right: No supraclavicular adenopathy.     Left: No supraclavicular adenopathy.    Abdominal:     General: Bowel sounds are normal. There is no distension  or abdominal bruit.     Palpations: Abdomen is soft. There is no shifting dullness, fluid wave, hepatomegaly, splenomegaly, mass or pulsatile mass.     Tenderness: There is no abdominal tenderness. There is no right CVA tenderness, left CVA tenderness, guarding or rebound.     Hernia: No hernia is present. There is no hernia in the left inguinal area or right inguinal area.  Genitourinary:    General: Normal vulva.     Exam position: Lithotomy position.     Pubic Area: No rash or pubic lice.      Tanner stage (genital): 5.     Labia:        Right: No rash, tenderness, lesion or injury.        Left: No rash, tenderness, lesion or injury.      Urethra: No prolapse, urethral pain, urethral swelling or urethral lesion.     Vagina: Normal.     Cervix: Discharge (very think clear discharge scant amount. ) present. No cervical motion tenderness, friability, lesion, erythema, cervical bleeding or eversion.     Uterus: Normal.      Adnexa: Right adnexa normal and left adnexa normal.       Right: No mass, tenderness or fullness.         Left: No mass, tenderness or fullness.       Rectum: Normal.  Musculoskeletal:        General: No tenderness or deformity. Normal range of motion.     Cervical back: Full passive range of motion without pain, normal range  of motion and neck supple. No edema, erythema or rigidity. No spinous process tenderness or muscular tenderness. Normal range of motion.  Lymphadenopathy:     Head:     Right side of head: No submental, submandibular, tonsillar, preauricular, posterior auricular or occipital adenopathy.     Left side of head: No submental, submandibular, tonsillar, preauricular, posterior auricular or occipital adenopathy.     Cervical: No cervical adenopathy.     Right cervical: No superficial, deep or posterior cervical adenopathy.    Left cervical: No superficial, deep or posterior cervical adenopathy.     Upper Body:     Right upper body: No supraclavicular or pectoral adenopathy.     Left upper body: No supraclavicular or pectoral adenopathy.     Lower Body: No right inguinal adenopathy. No left inguinal adenopathy.  Skin:    General: Skin is warm and dry.     Coloration: Skin is not pale.     Findings: No abrasion, bruising, burn, ecchymosis, erythema, lesion, petechiae or rash.     Nails: There is no clubbing.  Neurological:     General: No focal deficit present.     Mental Status: She is alert and oriented to person, place, and time. Mental status is at baseline.     GCS: GCS eye subscore is 4. GCS verbal subscore is 5. GCS motor subscore is 6.     Cranial Nerves: No cranial nerve deficit.     Sensory: No sensory deficit.     Motor: No weakness, tremor, atrophy, abnormal muscle tone or seizure activity.     Coordination: Coordination normal.     Gait: Gait normal.     Deep Tendon Reflexes: Reflexes are normal and symmetric. Reflexes normal. Babinski sign absent on the right side. Babinski sign absent on the left side.     Reflex Scores:      Tricep reflexes are 2+ on the right  side and 2+ on the left side.      Bicep reflexes are 2+ on the right side and 2+ on the left side.      Brachioradialis reflexes are 2+ on the right side and 2+ on the left side.      Patellar reflexes are 2+ on the  right side and 2+ on the left side.      Achilles reflexes are 2+ on the right side and 2+ on the left side. Psychiatric:        Mood and Affect: Mood normal.        Speech: Speech normal.        Behavior: Behavior normal.        Thought Content: Thought content normal.        Judgment: Judgment normal.       No results found for any visits on 09/10/20.  Assessment & Plan     Exposure to STD - Plan: RPR, HIV antibody (with reflex), Hepatitis, Acute, Hepatitis C Antibody, NuSwab Vaginitis Plus (VG+)  Concern about STD in female without diagnosis - Plan: RPR, HIV antibody (with reflex), Hepatitis, Acute, Hepatitis C Antibody, NuSwab Vaginitis Plus (VG+)  Body mass index 29.0-29.9, adult  Orders Placed This Encounter  Procedures  . RPR  . HIV antibody (with reflex)  . Hepatitis, Acute  . Hepatitis C Antibody  . NuSwab Vaginitis Plus (VG+)   Also needs routine health maintenance labs she did not have done at last visit, [facility] printing requisition for lab and will have her do maintenance labs today as well. Labs ordered 09/04/2019- CBC, lipid panel, TSH, CBC, CMP.   Return if any symptoms and discussed CDC recommendations for retesting.    No orders of the defined types were placed in this encounter. Red Flags discussed. The patient was given clear instructions to go to ER or return to medical center if any red flags develop, symptoms do not improve, worsen or new problems develop. They verbalized understanding.  Return if symptoms worsen or fail to improve, for at any time for any worsening symptoms, Go to Emergency room/ urgent care if worse.      The entirety of the information documented in the History of Present Illness, Review of Systems and Physical Exam were personally obtained by me. Portions of this information were initially documented by the CMA and reviewed by me for thoroughness and accuracy.   Jairo Ben, FNP  [facility] [phone] (phone) [phone] (fax)  [facility]

## 2020-09-10 NOTE — Patient Instructions (Addendum)
Preventing Sexually Transmitted Infections, Adult Sexually transmitted infections (STIs) are diseases that are spread from person to person (are contagious). They are spread, or transmitted, through bodily fluids exchanged during sex or sexual contact. These bodily fluids include saliva, semen, blood, vaginal mucus, and urine. STIs are very common among people of all ages. Some common STIs include:  Herpes.  Hepatitis B.  Chlamydia.  Gonorrhea.  Syphilis.  HPV (human papillomavirus).  HIV, also called the human immunodeficiency virus. This is the virus that can cause AIDS (acquired immunodeficiency syndrome). Often, people who have these STIs do not have symptoms. Even without symptoms, these infections can be spread from person to person and require treatment. How can these conditions affect me? STIs can be treated, and many STIs can be cured. However, some STIs cannot be cured and will affect you for the rest of your life. Certain STIs may:  Require you to take medicine for the rest of your life.  Affect your ability to have children (your fertility).  Increase your risk for developing another STI or certain serious health conditions. These may include: ? Cervical cancer. ? Head and neck cancer. ? Pelvic inflammatory disease (PID), in women. ? Organ damage or damage to other parts of your body, if the infection spreads.  Cause problems during pregnancy and may be transmitted to the baby during the pregnancy or childbirth. What can increase my risk? You may have an increased risk for developing an STI if:  You have unprotected sex. Sex includes oral, vaginal, or anal sex.  You have more than one sex partner.  You have a sex partner who has multiple sex partners.  You have sex with someone who has an STI.  You have an STI or you had an STI before.  You inject drugs or have a sex partner or partners who inject drugs. What actions can I take to prevent STIs? The only way  to completely prevent STIs is not to have sex of any kind. This is called practicing abstinence. If you are sexually active, you can protect yourself and others by taking these actions to lower your risk of getting an STI: Lifestyle Avoid mixing alcohol, drugs, and sex. Alcohol and drug use can affect your ability to make good decisions and can lead to risky sexual behaviors. Medicines Ask your health care provider about taking pre-exposure prophylaxis (PrEP) to prevent HIV infection. General information  Stay up to date on vaccinations. Certain vaccines can lower your risk of getting certain STIs, such as: ? Hepatitis A and hepatitis B vaccines. You may have been vaccinated as a young child, but you will likely need a booster shot as a teen or young adult. ? HPV (human papillomavirus) vaccine.  Have only one sex partner (be monogamous) or limit the number of sex partners you have.  Use methods that prevent the exchange of body fluids between partners (barrier protection) correctly every time you have sex. Barrier protection can be used during oral, vaginal, or anal sex. Commonly used barrier methods include: ? Female condom. ? Female condom. ? Dental dam.  Use a new condom for every sex act from start to finish.  Get tested for STIs. Have your partners get tested, too.  If you test positive for an STI, follow recommendations from your health care provider about treatment and make sure your sex partners are tested and treated.  Birth control pills, injections, implants, and intrauterine devices (IUDs) do not protect against STIs. To prevent both STIs and  pregnancy, always use a condom with another form of birth control.  Some STIs, such as herpes, are spread through skin-to-skin contact. A condom may not protect you from getting such STIs. Avoid all sexual contact if you or your partners have herpes and there is an active flare with open sores.   Where to find more information Learn more  about STIs from:  Centers for Disease Control and Prevention: ? More information about specific STIs: BloggingList.ca ? Places to get sexual health counseling and treatment for free or at a low cost: gettested.TonerPromos.no  [place] Department of Health and Human Services: http://hoffman.com/ Summary  Sexually transmitted infections (STIs) can spread through exchanging bodily fluids during sexual contact. Fluids include saliva, semen, blood, vaginal mucus, and urine.  You may have an increased risk for developing an STI if you have unprotected sex. Sex includes oral, vaginal, or anal sex.  If you do have sex, limit your number of sex partners and use barrier protection every time you have sex. This information is not intended to replace advice given to you by your health care provider. Make sure you discuss any questions you have with your health care provider. Document Revised: 09/23/2019 Document Reviewed: 09/23/2019 Elsevier Patient Education  2021 [employer]. Safe Sex Practicing safe sex means taking steps before and during sex to reduce your risk of:  Getting an STI (sexually transmitted infection).  Giving your partner an STI.  Unwanted or unplanned pregnancy. How to practice safe sex Ways you can practice safe sex  Limit your sexual partners to only one partner who is having sex with only you.  Avoid using alcohol and drugs before having sex. Alcohol and drugs can affect your judgment.  Before having sex with a new partner: ? Talk to your partner about past partners, past STIs, and drug use. ? Get screened for STIs and discuss the results with your partner. Ask your partner to get screened too.  Check your body regularly for sores, blisters, rashes, or unusual discharge. If you notice any of these problems, visit your health care provider.  Avoid sexual contact if you have symptoms of an infection or you are being treated for an STI.  While having sex, use a condom. Make sure  to: ? Use a condom every time you have vaginal, oral, or anal sex. Both females and males should wear condoms during oral sex. ? Keep condoms in place from the beginning to the end of sexual activity. ? Use a latex condom, if possible. Latex condoms offer the best protection. ? Use only water-based lubricants with a condom. Using petroleum-based lubricants or oils will weaken the condom and increase the chance that it will break.   Ways your health care provider can help you practice safe sex  See your health care provider for regular screenings, exams, and tests for STIs.  Talk with your health care provider about what kind of birth control (contraception) is best for you.  Get vaccinated against hepatitis B and human papillomavirus (HPV).  If you are at risk of being infected with HIV (human immunodeficiency virus), talk with your health care provider about taking a prescription medicine to prevent HIV infection. You are at risk for HIV if you: ? Are a man who has sex with other men. ? Are sexually active with more than one partner. ? Take drugs by injection. ? Have a sex partner who has HIV. ? Have unprotected sex. ? Have sex with someone who has sex with both  men and women. ? Have had an STI.   Follow these instructions at home:  Take over-the-counter and prescription medicines only as told by your health care provider.  Keep all follow-up visits. This is important. Where to find more information  Centers for Disease Control and Prevention: FootballExhibition.com.brwww.cdc.gov  Planned Parenthood: www.plannedparenthood.org  Office on [employer]: http://hoffman.com/www.womenshealth.gov Summary  Practicing safe sex means taking steps before and during sex to reduce your risk getting an STI, giving your partner an STI, and having an unwanted or unplanned pregnancy.  Before having sex with a new partner, talk to your partner about past partners, past STIs, and drug use.  Use a condom every time you have vaginal,  oral, or anal sex. Both females and males should wear condoms during oral sex.  Check your body regularly for sores, blisters, rashes, or unusual discharge. If you notice any of these problems, visit your health care provider.  See your health care provider for regular screenings, exams, and tests for STIs. This information is not intended to replace advice given to you by your health care provider. Make sure you discuss any questions you have with your health care provider. Document Revised: 01/13/2020 Document Reviewed: 01/13/2020 Elsevier Patient Education  2021 Elsevier Inc. Syphilis Test Why am I having this test? The syphilis test is used to diagnose syphilis and to help monitor syphilis treatment. Syphilis is a bacterial infection that commonly spreads through sexual contact. Syphilis may also spread to an unborn baby (fetus) through the blood of the mother. You may have this test if you have symptoms of syphilis, such as a painless sore (chancre). Symptoms often look like the symptoms of many other conditions. As syphilis gets worse, early symptoms may go away before new symptoms develop. Untreated syphilis (late-stage syphilis) can lead to severe complications, including damage to the heart, brain, and nervous system. Pregnant women often have a syphilis test. You may also have this test if you are at risk for syphilis because of:  Having a sexual partner with syphilis.  Engaging in high-risk sexual activity.  Having another STI (sexually transmitted infection), such as gonorrhea. What is being tested? This test checks your blood for antibodies to the bacteria that cause syphilis. Antibodies are proteins that your body makes in response to germs and other things that can make you sick. There are two types of blood tests to check for syphilis. Both tests are necessary to make a diagnosis:  Nontreponemal test. This is usually the first test. It can detect other kinds of antibodies as  well and may result in a positive result for syphilis even if you do not have the condition (false positive).  Treponemal test. This test is done if you get a positive result to the nontreponemal test. It tests specifically for antibodies to syphilis. Other conditions are not likely to cause a false positive. This test will not show whether antibodies are from a past syphilis infection or a current infection. What kind of sample is taken? A blood sample is required for this test. It is usually collected by inserting a needle into a blood vessel. If your health care provider thinks that you may have late-stage syphilis, you may have a lumbar puncture. This is a procedure in which a small sample of the fluid that surrounds the brain and spinal cord (cerebrospinal fluid or CSF) is removed to be examined for syphilis.   How do I prepare for this test?  Do not eat or drink anything  other than water starting 8 hours before the test, or as told by your health care provider.  Do not drink alcohol starting 24 hours before the test.  Tell a health care provider about any medical conditions you have. How are the results reported? Your results will be reported as positive or negative for syphilis antibodies. What do the results mean? If the result of your syphilis test is negative, no antibodies were present at the time of the test. This could mean:  You do not have syphilis.  The antibodies have not formed yet. Antibodies can take several weeks to form. If it is possible that you were recently exposed to syphilis, you may need to have the test again at a later time. If you test positive for syphilis on the first nontreponemal test, you will most likely have the second treponemal test to confirm the diagnosis. If the result of the second test is also positive, it is likely that you have syphilis. If the result of the second test is negative, you may need more tests to make sure that you do not have  syphilis. Talk with your health care provider about what your results mean. Questions to ask your health care provider Ask your health care provider, or the department that is doing the test:  When will my results be ready?  How will I get my results?  What are my treatment options?  What other tests do I need?  What are my next steps? Summary  The syphilis test is used to diagnose syphilis and to help monitor syphilis treatment. This test checks your blood for antibodies to the bacteria that cause syphilis.  There are two types of blood tests to check for syphilis. Both tests are necessary to make a diagnosis.  Talk with your health care provider about what your results mean. This information is not intended to replace advice given to you by your health care provider. Make sure you discuss any questions you have with your health care provider. Document Revised: 06/18/2020 Document Reviewed: 06/18/2020 Elsevier Patient Education  2021 Elsevier Inc.   Fat and Cholesterol Restricted Eating Plan Getting too much fat and cholesterol in your diet may cause health problems. Choosing the right foods helps keep your fat and cholesterol at normal levels. This can keep you from getting certain diseases. Your doctor may recommend an eating plan that includes:  Total fat: ______% or less of total calories a day.  Saturated fat: ______% or less of total calories a day.  Cholesterol: less than _________mg a day.  Fiber: ______g a day. What are tips for following this plan? Meal planning  At meals, divide your plate into four equal parts: ? Fill one-half of your plate with vegetables and green salads. ? Fill one-fourth of your plate with whole grains. ? Fill one-fourth of your plate with low-fat (lean) protein foods.  Eat fish that is high in omega-3 fats at least two times a week. This includes mackerel, tuna, sardines, and salmon.  Eat foods that are high in fiber, such as whole  grains, beans, apples, broccoli, carrots, peas, and barley. General tips  Work with your doctor to lose weight if you need to.  Avoid: ? Foods with added sugar. ? Fried foods. ? Foods with partially hydrogenated oils.  Limit alcohol intake to no more than 1 drink a day for nonpregnant women and 2 drinks a day for men. One drink equals 12 oz of beer, 5 oz of wine, or  1 oz of hard liquor.   Reading food labels  Check food labels for: ? Trans fats. ? Partially hydrogenated oils. ? Saturated fat (g) in each serving. ? Cholesterol (mg) in each serving. ? Fiber (g) in each serving.  Choose foods with healthy fats, such as: ? Monounsaturated fats. ? Polyunsaturated fats. ? Omega-3 fats.  Choose grain products that have whole grains. Look for the word "whole" as the first word in the ingredient list. Cooking  Cook foods using low-fat methods. These include baking, boiling, grilling, and broiling.  Eat more home-cooked foods. Eat at restaurants and buffets less often.  Avoid cooking using saturated fats, such as butter, cream, palm oil, palm kernel oil, and coconut oil. Recommended foods Fruits  All fresh, canned (in natural juice), or frozen fruits. Vegetables  Fresh or frozen vegetables (raw, steamed, roasted, or grilled). Green salads. Grains  Whole grains, such as whole wheat or whole grain breads, crackers, cereals, and pasta. Unsweetened oatmeal, bulgur, barley, quinoa, or brown rice. Corn or whole wheat flour tortillas. Meats and other protein foods  Ground beef (85% or leaner), grass-fed beef, or beef trimmed of fat. Skinless chicken or [place]. Ground chicken or [place]. Pork trimmed of fat. All fish and seafood. Egg whites. Dried beans, peas, or lentils. Unsalted nuts or seeds. Unsalted canned beans. Nut butters without added sugar or oil. Dairy  Low-fat or nonfat dairy products, such as skim or 1% milk, 2% or reduced-fat cheeses, low-fat and fat-free ricotta or  cottage cheese, or plain low-fat and nonfat yogurt. Fats and oils  Tub margarine without trans fats. Light or reduced-fat mayonnaise and salad dressings. Avocado. Olive, canola, sesame, or safflower oils. The items listed above may not be a complete list of foods and beverages you can eat. Contact a dietitian for more information.   Foods to avoid Fruits  Canned fruit in heavy syrup. Fruit in cream or butter sauce. Fried fruit. Vegetables  Vegetables cooked in cheese, cream, or butter sauce. Fried vegetables. Grains  White bread. White pasta. White rice. Cornbread. Bagels, pastries, and croissants. Crackers and snack foods that contain trans fat and hydrogenated oils. Meats and other protein foods  Fatty cuts of meat. Ribs, chicken wings, bacon, sausage, bologna, salami, chitterlings, fatback, hot dogs, bratwurst, and packaged lunch meats. Liver and organ meats. Whole eggs and egg yolks. Chicken and [place] with skin. Fried meat. Dairy  Whole or 2% milk, cream, half-and-half, and cream cheese. Whole milk cheeses. Whole-fat or sweetened yogurt. Full-fat cheeses. Nondairy creamers and whipped toppings. Processed cheese, cheese spreads, and cheese curds. Beverages  Alcohol. Sugar-sweetened drinks such as sodas, lemonade, and fruit drinks. Fats and oils  Butter, stick margarine, lard, shortening, ghee, or bacon fat. Coconut, palm kernel, and palm oils. Sweets and desserts  Corn syrup, sugars, honey, and molasses. Candy. Jam and jelly. Syrup. Sweetened cereals. Cookies, pies, cakes, donuts, muffins, and ice cream. The items listed above may not be a complete list of foods and beverages you should avoid. Contact a dietitian for more information. Summary  Choosing the right foods helps keep your fat and cholesterol at normal levels. This can keep you from getting certain diseases.  At meals, fill one-half of your plate with vegetables and green salads.  Eat high-fiber foods, like whole  grains, beans, apples, carrots, peas, and barley.  Limit added sugar, saturated fats, alcohol, and fried foods. This information is not intended to replace advice given to you by your health care provider. Make sure you discuss  any questions you have with your health care provider. Document Revised: 12/11/2019 Document Reviewed: 12/11/2019 Elsevier Patient Education  2021 Elsevier Inc.  

## 2020-09-11 LAB — COMPREHENSIVE METABOLIC PANEL
ALT: 14 IU/L (ref 0–32)
AST: 17 IU/L (ref 0–40)
Albumin/Globulin Ratio: 1.6 (ref 1.2–2.2)
Albumin: 4.6 g/dL (ref 3.9–5.0)
Alkaline Phosphatase: 84 IU/L (ref 44–121)
BUN/Creatinine Ratio: 12 (ref 9–23)
BUN: 10 mg/dL (ref 6–20)
Bilirubin Total: 0.3 mg/dL (ref 0.0–1.2)
CO2: 21 mmol/L (ref 20–29)
Calcium: 9.6 mg/dL (ref 8.7–10.2)
Chloride: 103 mmol/L (ref 96–106)
Creatinine, Ser: 0.84 mg/dL (ref 0.57–1.00)
GFR calc Af Amer: 111 mL/min/{1.73_m2} (ref >59)
GFR calc non Af Amer: 96 mL/min/{1.73_m2} (ref >59)
Globulin, Total: 2.8 g/dL (ref 1.5–4.5)
Glucose: 98 mg/dL (ref 65–99)
Potassium: 4.3 mmol/L (ref 3.5–5.2)
Sodium: 139 mmol/L (ref 134–144)
Total Protein: 7.4 g/dL (ref 6.0–8.5)

## 2020-09-11 LAB — CBC WITH DIFFERENTIAL/PLATELET
Basophils Absolute: 0.1 10*3/uL (ref 0.0–0.2)
Basos: 1 %
EOS (ABSOLUTE): 0 10*3/uL (ref 0.0–0.4)
Eos: 0 %
Hematocrit: 41.3 % (ref 34.0–46.6)
Hemoglobin: 13.7 g/dL (ref 11.1–15.9)
Immature Grans (Abs): 0 10*3/uL (ref 0.0–0.1)
Immature Granulocytes: 0 %
Lymphocytes Absolute: 2.1 10*3/uL (ref 0.7–3.1)
Lymphs: 15 %
MCH: 28.3 pg (ref 26.6–33.0)
MCHC: 33.2 g/dL (ref 31.5–35.7)
MCV: 85 fL (ref 79–97)
Monocytes Absolute: 0.6 10*3/uL (ref 0.1–0.9)
Monocytes: 4 %
Neutrophils Absolute: 11.5 10*3/uL — ABNORMAL HIGH (ref 1.4–7.0)
Neutrophils: 80 %
Platelets: 321 10*3/uL (ref 150–450)
RBC: 4.84 x10E6/uL (ref 3.77–5.28)
RDW: 12 % (ref 11.7–15.4)
WBC: 14.4 10*3/uL — ABNORMAL HIGH (ref 3.4–10.8)

## 2020-09-11 LAB — LIPID PANEL W/O CHOL/HDL RATIO
Cholesterol, Total: 152 mg/dL (ref 100–199)
HDL: 34 mg/dL — ABNORMAL LOW (ref >39)
LDL Chol Calc (NIH): 99 mg/dL (ref 0–99)
Triglycerides: 103 mg/dL (ref 0–149)
VLDL Cholesterol Cal: 19 mg/dL (ref 5–40)

## 2020-09-11 LAB — HEPATITIS PANEL, ACUTE
Hep A IgM: NEGATIVE
Hep B C IgM: NEGATIVE
Hep C Virus Ab: 1.3 s/co ratio — ABNORMAL HIGH (ref 0.0–0.9)
Hepatitis B Surface Ag: NEGATIVE

## 2020-09-11 LAB — HIV ANTIBODY (ROUTINE TESTING W REFLEX): HIV Screen 4th Generation wRfx: NONREACTIVE

## 2020-09-11 LAB — TSH: TSH: 1.16 u[IU]/mL (ref 0.450–4.500)

## 2020-09-11 LAB — RPR: RPR Ser Ql: NONREACTIVE

## 2020-09-11 NOTE — Progress Notes (Signed)
Acute hepatitis C lab is positive, please add on HCV Nucleic Acid Amplification test ([id number])./HCV RNA for confirmation.    Hepatitis A and B negative. HIV non reactive. RPR for syphilis is negative.  WBC and neutrophils elevated, watch for any signs of infection, she was asymptomatic, vaginal swab pending results. CMP within normal limits.

## 2020-09-14 ENCOUNTER — Other Ambulatory Visit: Payer: Self-pay | Admitting: Adult Health

## 2020-09-14 DIAGNOSIS — B9689 Other specified bacterial agents as the cause of diseases classified elsewhere: Secondary | ICD-10-CM | POA: Insufficient documentation

## 2020-09-14 DIAGNOSIS — N760 Acute vaginitis: Secondary | ICD-10-CM

## 2020-09-14 LAB — NUSWAB VAGINITIS PLUS (VG+)
Atopobium vaginae: HIGH Score — AB
BVAB 2: HIGH Score — AB
Candida albicans, NAA: NEGATIVE
Candida glabrata, NAA: NEGATIVE
Chlamydia trachomatis, NAA: NEGATIVE
Megasphaera 1: HIGH Score — AB
Neisseria gonorrhoeae, NAA: NEGATIVE
Trich vag by NAA: NEGATIVE

## 2020-09-14 LAB — SPECIMEN STATUS REPORT

## 2020-09-14 MED ORDER — METRONIDAZOLE 500 MG PO TABS: 500.0000 mg | ORAL_TABLET | Freq: Two times a day (BID) | ORAL | 0 refills | Status: DC

## 2020-09-14 NOTE — Progress Notes (Signed)
Meds ordered this encounter  Medications  . metroNIDAZOLE (FLAGYL) 500 MG tablet    Sig: Take 1 tablet (500 mg total) by mouth 2 (two) times daily.    Dispense:  14 tablet    Refill:  0    Bacterial vaginitis - Plan: metroNIDAZOLE (FLAGYL) 500 MG tablet

## 2020-12-31 ENCOUNTER — Other Ambulatory Visit: Payer: Self-pay

## 2020-12-31 ENCOUNTER — Ambulatory Visit
Admission: EM | Admit: 2020-12-31 | Discharge: 2020-12-31 | Disposition: A | Discharge status: Home / Self Care | Payer: Medicaid Other | Attending: Emergency Medicine | Admitting: Emergency Medicine

## 2020-12-31 DIAGNOSIS — Z113 Encounter for screening for infections with a predominantly sexual mode of transmission: Secondary | ICD-10-CM | POA: Insufficient documentation

## 2020-12-31 NOTE — ED Provider Notes (Signed)
Renaldo Fiddler    CSN: [id number] Arrival date & time: 12/31/20  0931      History   Chief Complaint Chief Complaint  Patient presents with  . SEXUALLY TRANSMITTED DISEASE    HPI [name] [name] is a 25 y.o. female.   Patient presents with request for STD screening.  She is asymptomatic but states she gets tested regularly.  She denies vaginal discharge, pelvic pain, fever, abdominal pain, dysuria, rash, lesions, or other symptoms.  Her medical history includes bacterial vaginitis, gestational diabetes, history of substance abuse.  The history is provided by the patient and medical records.    Past Medical History:  Diagnosis Date  . Allergy   . Gestational diabetes     Patient Active Problem List   Diagnosis Date Noted  . Bacterial vaginitis 09/14/2020  . Right pyosalpinx 06/13/2019  . Normal vaginal delivery 08/20/2018  . Postpartum care following vaginal delivery 08/20/2018  . Indication for care in labor and delivery, antepartum 08/17/2018  . High-risk pregnancy, third trimester 07/12/2018  . Diet controlled gestational diabetes mellitus (GDM) in third trimester 06/26/2018  . History of substance use 06/12/2018  . Supervision of normal first pregnancy, antepartum 01/18/2018    Past Surgical History:  Procedure Laterality Date  . MOUTH SURGERY      OB History    Gravida  1   Para  1   Term  1   Preterm      AB      Living  1     SAB      IAB      Ectopic      Multiple  0   Live Births  1            Home Medications    Prior to Admission medications   Medication Sig Start Date End Date Taking? Authorizing Provider  metroNIDAZOLE (FLAGYL) 500 MG tablet Take 1 tablet (500 mg total) by mouth 2 (two) times daily. 09/14/20   Flinchum, Eula Fried, FNP    Family History Family History  Problem Relation Age of Onset  . Hypertension Maternal Grandmother   . Diabetes Other        maternal great aunts and uncles  . Cervical cancer  Maternal Great-grandmother     Social History Social History   Tobacco Use  . Smoking status: Never Smoker  . Smokeless tobacco: Never Used  Vaping Use  . Vaping Use: Never used  Substance Use Topics  . Alcohol use: Never  . Drug use: Not Currently    Comment: UDS positive for MJ at [facility]     Allergies   Peanut-containing drug products   Review of Systems Review of Systems  Constitutional: Negative for chills and fever.  Respiratory: Negative for cough and shortness of breath.   Cardiovascular: Negative for chest pain and palpitations.  Gastrointestinal: Negative for abdominal pain and vomiting.  Genitourinary: Negative for dysuria, hematuria, pelvic pain and vaginal discharge.  Skin: Negative for color change and rash.  All other systems reviewed and are negative.    Physical Exam Triage Vital Signs ED Triage Vitals  Enc Vitals Group     BP      Pulse      Resp      Temp      Temp src      SpO2      Weight      Height      Head Circumference  Peak Flow      Pain Score      Pain Loc      Pain Edu?      Excl. in GC?    No data found.  Updated Vital Signs BP 131/66 (BP Location: Left Arm)   Temp 98.8 F (37.1 C) (Oral)   Resp 18   LMP 12/18/2020 (Exact Date)   SpO2 98%   Visual Acuity Right Eye Distance:   Left Eye Distance:   Bilateral Distance:    Right Eye Near:   Left Eye Near:    Bilateral Near:     Physical Exam Vitals and nursing note reviewed.  Constitutional:      General: She is not in acute distress.    Appearance: She is well-developed. She is not ill-appearing.  HENT:     Head: Normocephalic and atraumatic.     Mouth/Throat:     Mouth: Mucous membranes are moist.  Eyes:     Conjunctiva/sclera: Conjunctivae normal.  Cardiovascular:     Rate and Rhythm: Normal rate and regular rhythm.     Heart sounds: Normal heart sounds.  Pulmonary:     Effort: Pulmonary effort is normal. No respiratory distress.     Breath sounds:  Normal breath sounds.  Abdominal:     Palpations: Abdomen is soft.     Tenderness: There is no abdominal tenderness. There is no right CVA tenderness, left CVA tenderness, guarding or rebound.  Musculoskeletal:     Cervical back: Neck supple.  Skin:    General: Skin is warm and dry.  Neurological:     General: No focal deficit present.     Mental Status: She is alert and oriented to person, place, and time.     Gait: Gait normal.  Psychiatric:        Mood and Affect: Mood normal.        Behavior: Behavior normal.      UC Treatments / Results  Labs (all labs ordered are listed, but only abnormal results are displayed) Labs Reviewed  RPR  HIV ANTIBODY (ROUTINE TESTING W REFLEX)  CERVICOVAGINAL ANCILLARY ONLY    EKG   Radiology No results found.  Procedures Procedures (including critical care time)  Medications Ordered in UC Medications - No data to display  Initial Impression / Assessment and Plan / UC Course  I have reviewed the triage vital signs and the nursing notes.  Pertinent labs & imaging results that were available during my care of the patient were reviewed by me and considered in my medical decision making (see chart for details).   Screening for STDs.  Patient obtained vaginal self swab for testing.  Blood drawn for RPR and HIV.  Instructed patient to abstain from sexual activity until the test results are back.  Discussed that we will call her and she may need treatment at that time if her test results are positive.  Instructed her to follow-up with her PCP as needed.  She agrees to plan of care.   Final Clinical Impressions(s) / UC Diagnoses   Final diagnoses:  Screening for STD (sexually transmitted disease)     Discharge Instructions      Your STD tests are pending.  If your test results are positive, we will call you.    Follow up with your primary care provider as needed.         ED Prescriptions    None     PDMP not reviewed this  encounter.  Mickie Bail, NP 12/31/20 [phone]

## 2020-12-31 NOTE — ED Triage Notes (Signed)
Pt presents for STD check.  States she is not having any symptoms but gets checked regularly and cannot get in with PCP right now.  Is having unprotected intercourse with one partner.

## 2020-12-31 NOTE — Discharge Instructions (Addendum)
  Your STD tests are pending.  If your test results are positive, we will call you.    Follow up with your primary care provider as needed.

## 2021-01-01 ENCOUNTER — Telehealth (HOSPITAL_COMMUNITY): Payer: Self-pay | Admitting: Emergency Medicine

## 2021-01-01 LAB — CERVICOVAGINAL ANCILLARY ONLY
Bacterial Vaginitis (gardnerella): POSITIVE — AB
Candida Glabrata: NEGATIVE
Candida Vaginitis: NEGATIVE
Chlamydia: NEGATIVE
Comment: NEGATIVE
Comment: NEGATIVE
Comment: NEGATIVE
Comment: NEGATIVE
Comment: NEGATIVE
Comment: NORMAL
Neisseria Gonorrhea: NEGATIVE
Trichomonas: NEGATIVE

## 2021-01-01 MED ORDER — METRONIDAZOLE 500 MG PO TABS: 500.0000 mg | ORAL_TABLET | Freq: Two times a day (BID) | ORAL | 0 refills | Status: DC

## 2021-01-05 LAB — RPR: RPR Ser Ql: NONREACTIVE

## 2021-01-05 LAB — HIV ANTIBODY (ROUTINE TESTING W REFLEX): HIV Screen 4th Generation wRfx: NONREACTIVE

## 2021-03-22 ENCOUNTER — Ambulatory Visit (LOCAL_COMMUNITY_HEALTH_CENTER): Payer: Medicaid Other

## 2021-03-22 ENCOUNTER — Other Ambulatory Visit: Payer: Self-pay

## 2021-03-22 DIAGNOSIS — Z111 Encounter for screening for respiratory tuberculosis: Secondary | ICD-10-CM

## 2021-03-25 ENCOUNTER — Ambulatory Visit (LOCAL_COMMUNITY_HEALTH_CENTER): Payer: Medicaid Other

## 2021-03-25 ENCOUNTER — Other Ambulatory Visit: Payer: Self-pay

## 2021-03-25 DIAGNOSIS — Z111 Encounter for screening for respiratory tuberculosis: Secondary | ICD-10-CM

## 2021-03-25 LAB — TB SKIN TEST
Induration: 0 mm
TB Skin Test: NEGATIVE

## 2021-05-11 ENCOUNTER — Other Ambulatory Visit: Payer: Self-pay

## 2021-05-11 ENCOUNTER — Encounter: Payer: Self-pay | Admitting: Family Medicine

## 2021-05-11 ENCOUNTER — Other Ambulatory Visit (HOSPITAL_COMMUNITY)
Admission: RE | Admit: 2021-05-11 | Discharge: 2021-05-11 | Disposition: A | Discharge status: Home / Self Care | Payer: Medicaid Other | Source: Ambulatory Visit | Attending: Family Medicine | Admitting: Family Medicine

## 2021-05-11 ENCOUNTER — Ambulatory Visit (INDEPENDENT_AMBULATORY_CARE_PROVIDER_SITE_OTHER): Payer: Medicaid Other | Admitting: Family Medicine

## 2021-05-11 VITALS — BP 120/69 | HR 87 | Temp 98.9°F | Resp 16 | Ht 60.0 in | Wt 157.0 lb

## 2021-05-11 DIAGNOSIS — Z202 Contact with and (suspected) exposure to infections with a predominantly sexual mode of transmission: Secondary | ICD-10-CM | POA: Insufficient documentation

## 2021-05-11 NOTE — Progress Notes (Signed)
      Established patient visit   Patient: [name] [name]   DOB: 1996/06/03   25 y.o. Female  MRN: [id number] Visit Date: 05/11/2021  Today's healthcare provider: Megan Mans, MD   Chief Complaint  Patient presents with   Exposure to STD   Subjective    Exposure to STD  This is a new problem. The vaginal discharge was normal. Pertinent negatives include no abdominal pain, anorexia, fever, genital odor or urinary frequency. She has tried nothing for the symptoms. Risk factors include history of STDs.    Patient wants to be checked. No current symptoms.     Medications: Outpatient Medications Prior to Visit  Medication Sig   metroNIDAZOLE (FLAGYL) 500 MG tablet Take 1 tablet (500 mg total) by mouth 2 (two) times daily. (Patient not taking: Reported on 05/11/2021)   No facility-administered medications prior to visit.    Review of Systems  Constitutional:  Negative for fever.  Gastrointestinal:  Negative for abdominal pain and anorexia.  Genitourinary:  Negative for frequency.       Objective    BP 120/69   Pulse 87   Temp 98.9 F (37.2 C)   Resp 16   Ht 5' (1.524 m)   Wt 157 lb (71.2 kg)   HC 16" (40.6 cm)   BMI 30.66 kg/m  BP Readings from Last 3 Encounters:  05/11/21 120/69  12/31/20 131/66  09/10/20 123/72   Wt Readings from Last 3 Encounters:  05/11/21 157 lb (71.2 kg)  09/10/20 151 lb 12.8 oz (68.9 kg)  02/07/20 154 lb (69.9 kg)      Physical Exam Vitals reviewed.  Constitutional:      Appearance: Normal appearance.  Cardiovascular:     Rate and Rhythm: Normal rate and regular rhythm.  Pulmonary:     Effort: Pulmonary effort is normal.  Abdominal:     Palpations: Abdomen is soft.  Skin:    General: Skin is warm and dry.  Neurological:     Mental Status: She is alert and oriented to person, place, and time.  Psychiatric:        Mood and Affect: Mood normal.        Behavior: Behavior normal.      No results found for any visits on  05/11/21.  Assessment & Plan     1. Exposure to STD  - HIV antibody (with reflex) - Hepatitis C antibody - RPR - Urine cytology ancillary only   No follow-ups on file.      I, Megan Mans, MD, have reviewed all documentation for this visit. The documentation on 05/15/21 for the exam, diagnosis, procedures, and orders are all accurate and complete.    Clare Casto Wendelyn Breslow, MD  [facility] [phone] (phone) [phone] (fax)  [facility]

## 2021-05-12 LAB — HEPATITIS C ANTIBODY: Hep C Virus Ab: 1.6 s/co ratio — ABNORMAL HIGH (ref 0.0–0.9)

## 2021-05-12 LAB — RPR: RPR Ser Ql: NONREACTIVE

## 2021-05-12 LAB — HIV ANTIBODY (ROUTINE TESTING W REFLEX): HIV Screen 4th Generation wRfx: NONREACTIVE

## 2021-05-17 LAB — URINE CYTOLOGY ANCILLARY ONLY
Bacterial Vaginitis-Urine: NEGATIVE
Candida Urine: NEGATIVE
Chlamydia: NEGATIVE
Comment: NEGATIVE
Comment: NEGATIVE
Comment: NORMAL
Neisseria Gonorrhea: NEGATIVE
Trichomonas: NEGATIVE

## 2021-05-17 LAB — HCV RNA DIAGNOSIS, NAA

## 2021-05-17 LAB — SPECIMEN STATUS REPORT

## 2021-05-19 ENCOUNTER — Encounter: Payer: Self-pay | Admitting: Family Medicine

## 2021-07-19 ENCOUNTER — Telehealth: Payer: Self-pay

## 2021-07-19 NOTE — Telephone Encounter (Signed)
Copied from CRM [id number]. Topic: Appointment Scheduling - Scheduling Inquiry for Clinic >> Jul 19, 2021  8:46 AM Fanny Bien wrote: Reason for CRM: Pt called and stated that she would like to come tomorrow for an STD check. Please advise

## 2021-07-19 NOTE — Telephone Encounter (Signed)
Spoke with patient and advised that schedules were filled tomorrow, arranged appt to be seen by Lillia Abed on Wednesday 07/21/2021. KW

## 2021-07-21 ENCOUNTER — Other Ambulatory Visit: Payer: Self-pay

## 2021-07-21 ENCOUNTER — Other Ambulatory Visit (HOSPITAL_COMMUNITY)
Admission: RE | Admit: 2021-07-21 | Discharge: 2021-07-21 | Disposition: A | Discharge status: Home / Self Care | Payer: Medicaid Other | Source: Ambulatory Visit | Attending: Physician Assistant | Admitting: Physician Assistant

## 2021-07-21 ENCOUNTER — Ambulatory Visit: Payer: Medicaid Other | Admitting: Physician Assistant

## 2021-07-21 ENCOUNTER — Encounter: Payer: Self-pay | Admitting: Physician Assistant

## 2021-07-21 VITALS — BP 136/83 | HR 101 | Temp 97.7°F | Resp 16 | Wt 155.4 lb

## 2021-07-21 DIAGNOSIS — R768 Other specified abnormal immunological findings in serum: Secondary | ICD-10-CM

## 2021-07-21 DIAGNOSIS — Z202 Contact with and (suspected) exposure to infections with a predominantly sexual mode of transmission: Secondary | ICD-10-CM | POA: Insufficient documentation

## 2021-07-21 DIAGNOSIS — Z113 Encounter for screening for infections with a predominantly sexual mode of transmission: Secondary | ICD-10-CM | POA: Diagnosis not present

## 2021-07-21 NOTE — Progress Notes (Signed)
      Established patient visit   Patient: [name] [name]   DOB: 1995/08/26   25 y.o. Female  MRN: [id number] Visit Date: 07/21/2021  Today's healthcare provider: Alfredia Ferguson, PA-C   Chief Complaint  Patient presents with   Exposure to STD   Subjective     [name] [name] is a 25 y/o female who presents today for STD screening. She is sexually active with different partners and routinely gets checked. Denies any recent known contact, denies symptoms--genital odor, discharge, urinary frequency, dysuria, itching, rash, abdominal pain.  Medications: Outpatient Medications Prior to Visit  Medication Sig   metroNIDAZOLE (FLAGYL) 500 MG tablet Take 1 tablet (500 mg total) by mouth 2 (two) times daily. (Patient not taking: Reported on 05/11/2021)   No facility-administered medications prior to visit.    Review of Systems  Constitutional:  Negative for fever.  Gastrointestinal:  Negative for abdominal pain.  Genitourinary:  Negative for dysuria and frequency.      Objective    BP 136/83 (BP Location: Left Arm, Patient Position: Sitting, Cuff Size: Large)   Pulse (!) 101   Temp 97.7 F (36.5 C) (Temporal)   Resp 16   Wt 155 lb 6.4 oz (70.5 kg)   SpO2 100%   BMI 30.35 kg/m    Physical Exam Constitutional:      Appearance: Normal appearance.  Cardiovascular:     Rate and Rhythm: Normal rate.  Pulmonary:     Effort: Pulmonary effort is normal.  Neurological:     Mental Status: She is alert and oriented to person, place, and time.  Psychiatric:        Mood and Affect: Mood normal.        Behavior: Behavior normal.     No results found for any visits on 07/21/21.  Assessment & Plan     STI screening Recently 9/22 had HIV and RPR, recommend this once a year if she wishes, with known exposure, or high risk sexual activity. Hep C Ab were +, ordered Hep C RNA.  Urine STI screening  Return will f/u with results.      I, Alfredia Ferguson, PA-C have reviewed all documentation  for this visit. The documentation on  07/21/2021 for the exam, diagnosis, procedures, and orders are all accurate and complete.    Alfredia Ferguson, PA-C  [facility] [phone] (phone) [phone] (fax)  [facility]

## 2021-07-23 LAB — CERVICOVAGINAL ANCILLARY ONLY
Bacterial Vaginitis-Urine: NEGATIVE
Candida Urine: NEGATIVE
Chlamydia: NEGATIVE
Comment: NEGATIVE
Comment: NEGATIVE
Comment: NORMAL
Neisseria Gonorrhea: NEGATIVE
Trichomonas: NEGATIVE

## 2021-07-23 LAB — HCV RNA QUANT RFLX ULTRA OR GENOTYP: HCV Quant Baseline: NOT DETECTED IU/mL

## 2021-07-23 LAB — HCV RNA DIAGNOSIS, NAA: HCV RNA, Quantitation: NOT DETECTED IU/mL

## 2021-09-07 IMAGING — US US PELVIS COMPLETE TRANSABD/TRANSVAG W DUPLEX
1 series · 13 of 25 positions shown · non-contrast
Comparison: None similar

CLINICAL DATA: Right lower quadrant pain for 5 days

EXAM:
TRANSABDOMINAL AND TRANSVAGINAL ULTRASOUND OF PELVIS
DOPPLER ULTRASOUND OF OVARIES
TECHNIQUE: Both transabdominal and transvaginal ultrasound examinations of the
pelvis were performed. Transabdominal technique was performed for
global imaging of the pelvis including uterus, ovaries, adnexal
regions, and pelvic cul-de-sac.
It was necessary to proceed with endovaginal exam following the
transabdominal exam to visualize the ovaries. Color and duplex
Doppler ultrasound was utilized to evaluate blood flow to the
ovaries.

[Series 1: us pelvis complete transabd/transvag w duplex · 13 of 110 slices shown]
[im 1/110]
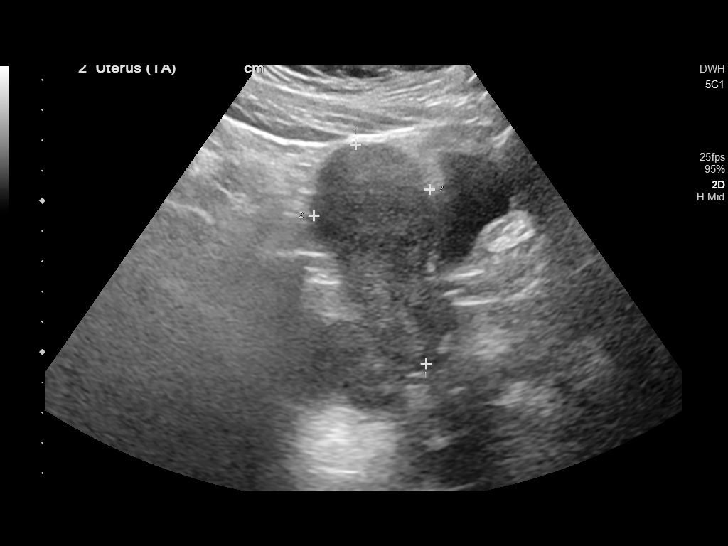
[im 10/110]
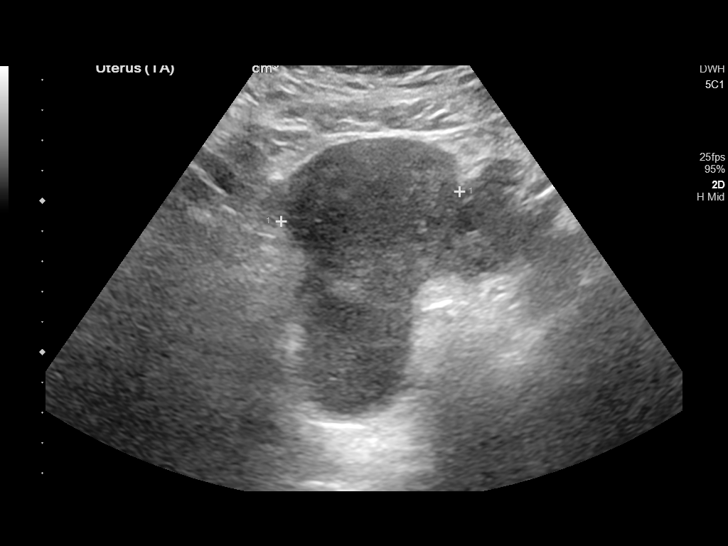
[im 19/110]
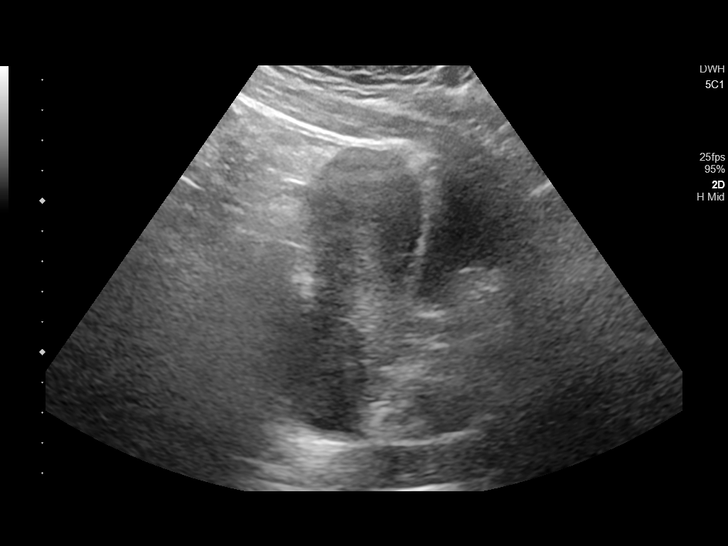
[im 28/110]
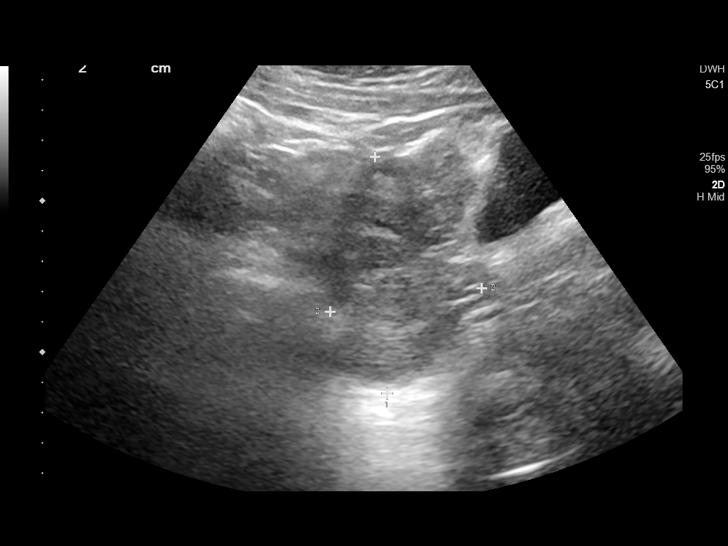
[im 37/110]
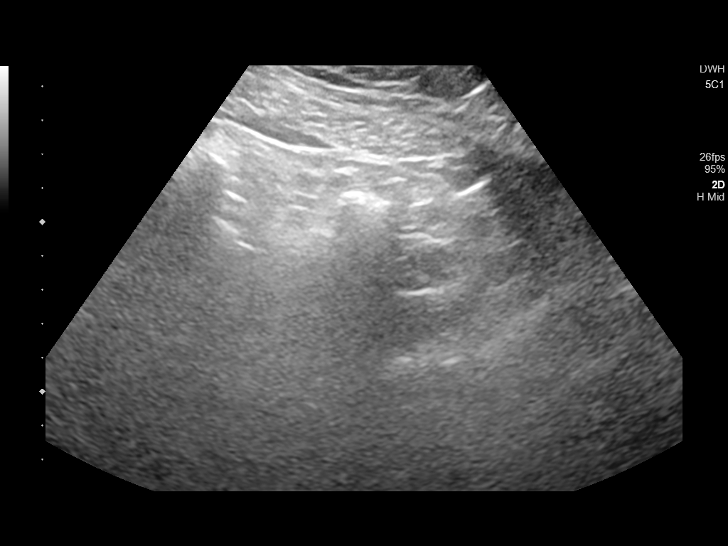
[im 46/110]
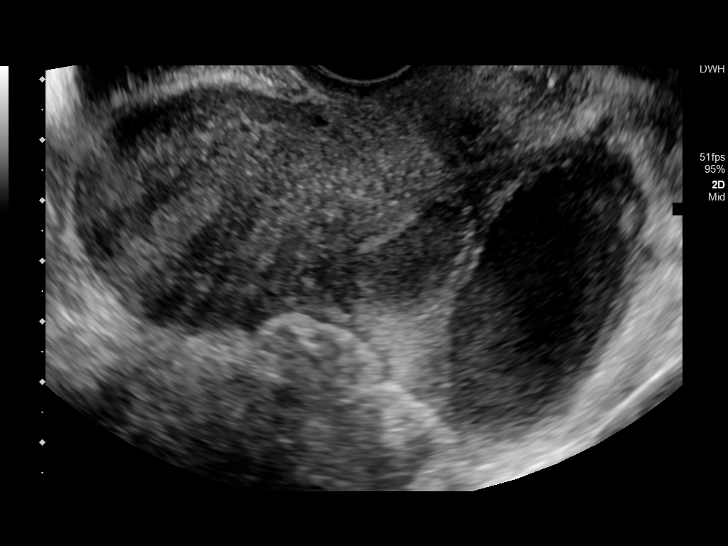
[im 55/110]
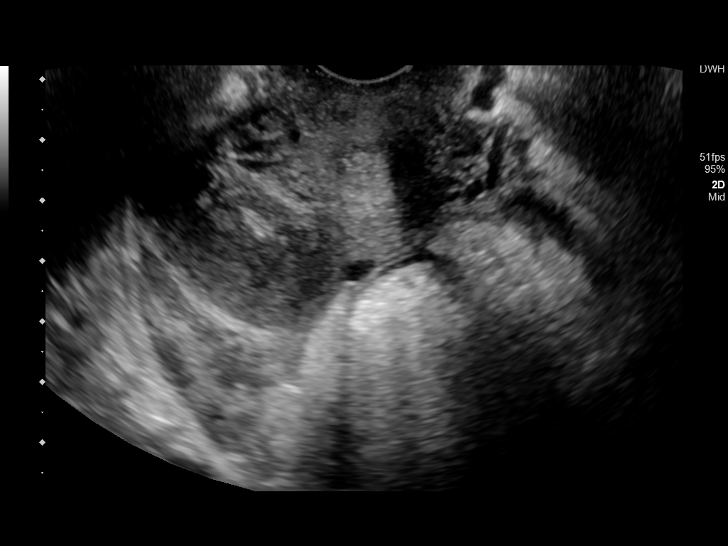
[im 64/110]
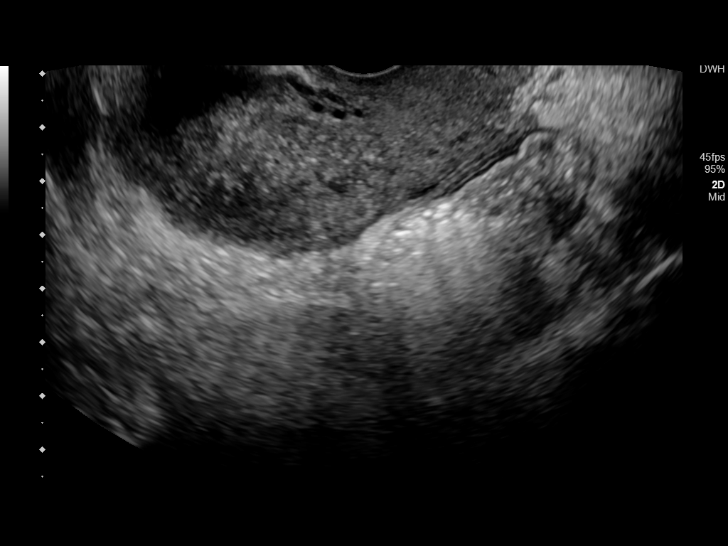
[im 73/110]
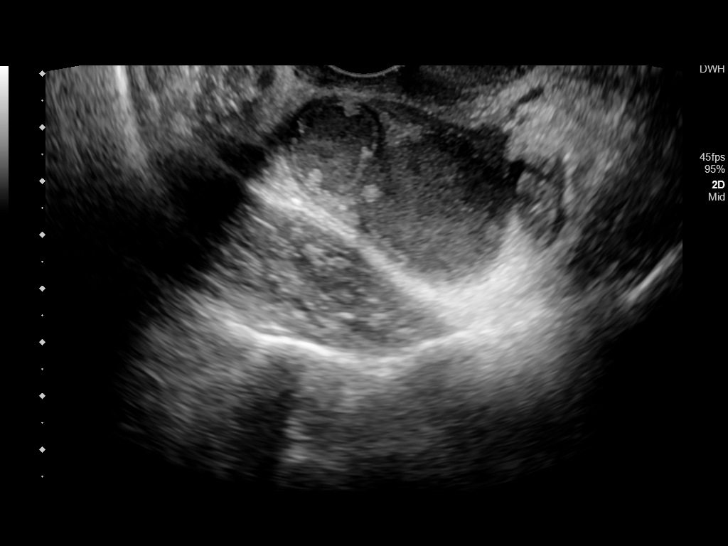
[im 82/110]
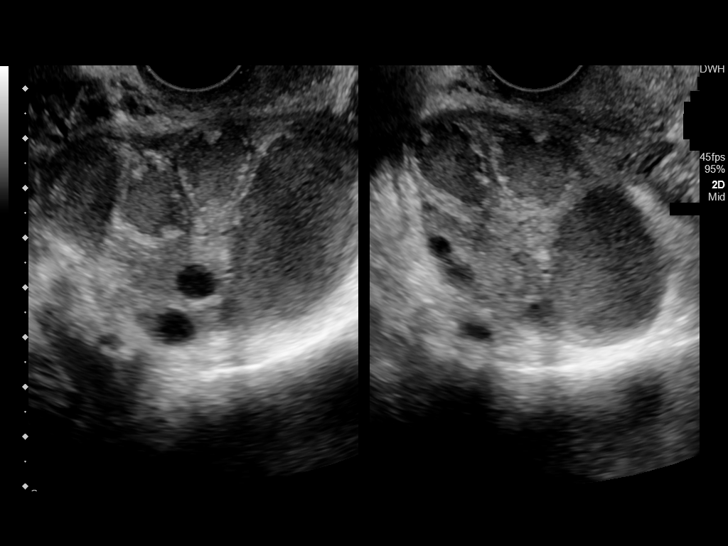
[im 91/110]
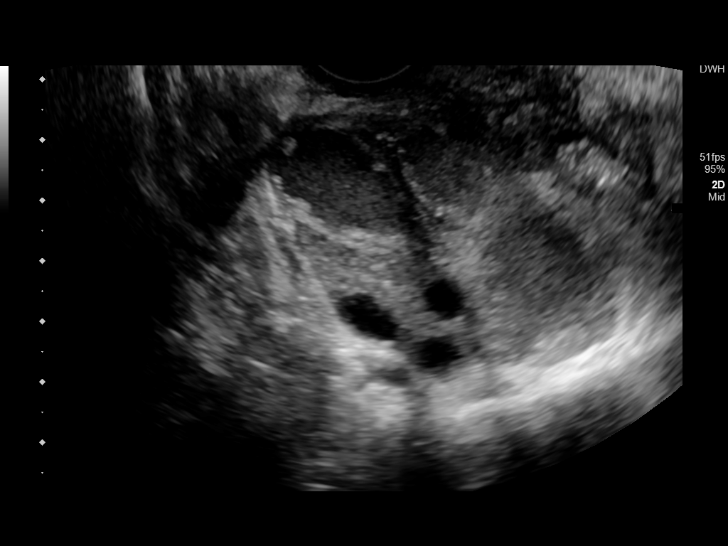
[im 100/110]
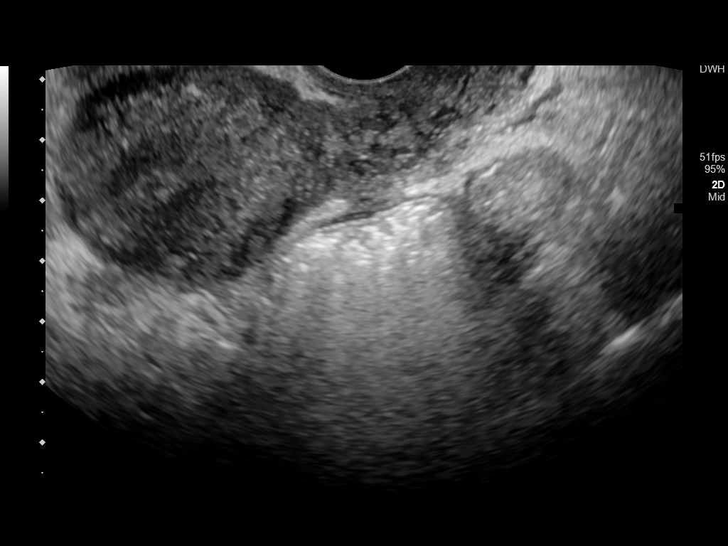
[im 110/110]
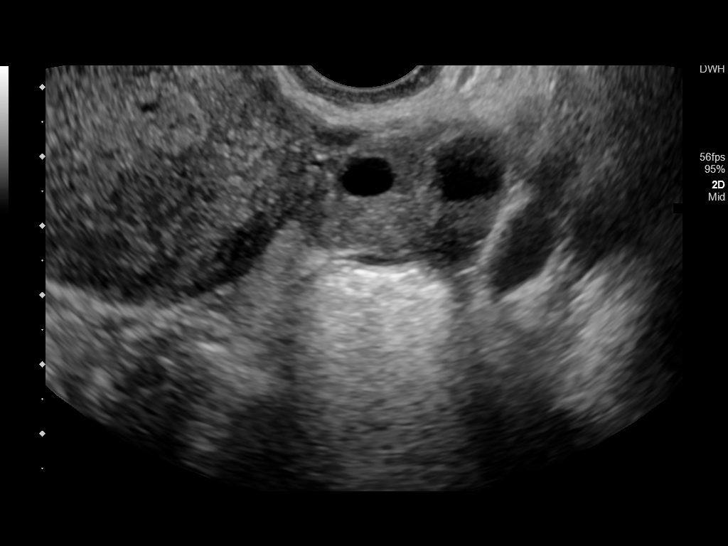

[13 of 25 positions shown; findings below may reference images not displayed]

FINDINGS: Uterus

Measurements: 8 x 4 x 6 cm = volume: 100 mL. No fibroids or other
mass visualized.

Endometrium

Thickness: 5 mm.  Minimal fluid in the fundic endometrial cavity

Right ovary

Measurements: 37 x 16 x 29 mm = volume: 9.4 mL. Normal appearance of
the ovary. There is a tubular structure with probable thickened
endosalpingeal folds that is peripherally hypervascular and contains
mid level echoes.

Left ovary

Measurements: 38 x 23 x 28 mm = volume: 13 mL. Normal appearance/no
adnexal mass.

Pulsed Doppler evaluation of both ovaries demonstrates normal
low-resistance arterial and venous waveforms.

Other findings

No abnormal free fluid.
IMPRESSION: 1. Dilated right tube containing particulate material, probable
pyosalpinx.
2. Normal appearance of the ovaries and uterus.

## 2021-10-28 NOTE — Progress Notes (Signed)
?  ? ?I,Sha'taria Tyson,acting as a scribe for Jacky Kindle, FNP.,have documented all relevant documentation on the behalf of Jacky Kindle, FNP,as directed by  Jacky Kindle, FNP while in the presence of Jacky Kindle, FNP. ? ?Established patient visit ? ? ?Patient: [name] [name]   DOB: 1996-07-31   26 y.o. Female  MRN: [id number] ?Visit Date: 10/29/2021 ? ?Today's healthcare provider: Jacky Kindle, FNP  ? ?Introduced to Publishing rights manager role and practice setting.  All questions answered.  Discussed provider/patient relationship and expectations. ? ?Chief Complaint  ?Patient presents with  ? STD Screening  ? ?Subjective  ?  ?HPI  ? ?Patient is being seen today for std screening. Patient reports she prefers to do testing every 3 months if posible and has not had any testing done since December. Would just like routine check. Reports last menstrual was Feburary 9-15 th with no differences. No form of birth control. ? ?Medications: ?Outpatient Medications Prior to Visit  ?Medication Sig  ? metroNIDAZOLE (FLAGYL) 500 MG tablet Take 1 tablet (500 mg total) by mouth 2 (two) times daily. (Patient not taking: Reported on 05/11/2021)  ? ?No facility-administered medications prior to visit.  ? ? ?Review of Systems ? ? ?  Objective  ?  ?BP 126/75 (BP Location: Right Arm, Patient Position: Sitting, Cuff Size: Normal)   Pulse 76   Temp (!) 97.3 ?F (36.3 ?C) (Temporal)   Ht 5' (1.524 m)   Wt 159 lb 4.8 oz (72.3 kg)   BMI 31.11 kg/m?  ? ? ?Physical Exam ?Vitals and nursing note reviewed.  ?Constitutional:   ?   General: She is not in acute distress. ?   Appearance: Normal appearance. She is obese. She is not ill-appearing, toxic-appearing or diaphoretic.  ?HENT:  ?   Head: Normocephalic and atraumatic.  ?Cardiovascular:  ?   Rate and Rhythm: Normal rate and regular rhythm.  ?   Pulses: Normal pulses.  ?   Heart sounds: Normal heart sounds. No murmur heard. ?  No friction rub. No gallop.  ?Pulmonary:  ?   Effort: Pulmonary  effort is normal. No respiratory distress.  ?   Breath sounds: Normal breath sounds. No stridor. No wheezing, rhonchi or rales.  ?Chest:  ?   Chest wall: No tenderness.  ?Abdominal:  ?   General: Bowel sounds are normal.  ?   Palpations: Abdomen is soft.  ?Musculoskeletal:     ?   General: No swelling, tenderness, deformity or signs of injury. Normal range of motion.  ?   Right lower leg: No edema.  ?   Left lower leg: No edema.  ?Skin: ?   General: Skin is warm and dry.  ?   Capillary Refill: Capillary refill takes less than 2 seconds.  ?   Coloration: Skin is not jaundiced or pale.  ?   Findings: No bruising, erythema, lesion or rash.  ?Neurological:  ?   General: No focal deficit present.  ?   Mental Status: She is alert and oriented to person, place, and time. Mental status is at baseline.  ?   Cranial Nerves: No cranial nerve deficit.  ?   Sensory: No sensory deficit.  ?   Motor: No weakness.  ?   Coordination: Coordination normal.  ?Psychiatric:     ?   Mood and Affect: Mood normal.     ?   Behavior: Behavior normal.     ?   Thought Content: Thought content  normal.     ?   Judgment: Judgment normal.  ?  ? ?No results found for any visits on 10/29/21. ? Assessment & Plan  ?  ? ?Problem List Items Addressed This Visit   ? ?  ? Other  ? Class 1 obesity due to excess calories without serious comorbidity with body mass index (BMI) of 31.0 to 31.9 in adult  ?  Body mass index is 31.11 kg/m?. ?Discussed importance of healthy weight management ?Discussed diet and exercise ? ?  ?  ? History of gestational diabetes  ?  Recommend A1c testing at CPE given hx of DM ?Continue to recommend balanced, lower carb meals. Smaller meal size, adding snacks. Choosing water as drink of choice and increasing purposeful exercise. ? ?  ?  ? History of substance use  ?  Remote hx of MJ use ?No acute concern ? ?  ?  ? Routine screening for STI (sexually transmitted infection) - Primary  ?  Request for 3 month "routine STI screening" ?-no  longer reports high risk behavior ?-denies symptoms ?-denies need for HIV and Hep C, blood draws today ?-not interested in contraceptives; encouraged barrier method to prevent STI, discussion of use of PREP to prevent HIV ?-has 48 year old daughter, would be OK if she got pregnant again ?  ?  ? Relevant Orders  ? Urine cytology ancillary only  ? ? ? ?Return in about 3 months (around 01/29/2022) for annual examination.  ?   ? ?I, Jacky Kindle, FNP, have reviewed all documentation for this visit. The documentation on 10/29/21 for the exam, diagnosis, procedures, and orders are all accurate and complete. ? ? ? ?Jacky Kindle, FNP  ?[facility] ?[phone] (phone) ?[phone] (fax) ? ?[facility] ? ?

## 2021-10-29 ENCOUNTER — Ambulatory Visit: Payer: Medicaid Other | Admitting: Family Medicine

## 2021-10-29 ENCOUNTER — Other Ambulatory Visit: Payer: Self-pay

## 2021-10-29 ENCOUNTER — Other Ambulatory Visit (HOSPITAL_COMMUNITY)
Admission: RE | Admit: 2021-10-29 | Discharge: 2021-10-29 | Disposition: A | Discharge status: Home / Self Care | Payer: Medicaid Other | Source: Ambulatory Visit | Attending: Family Medicine | Admitting: Family Medicine

## 2021-10-29 ENCOUNTER — Encounter: Payer: Self-pay | Admitting: Family Medicine

## 2021-10-29 VITALS — BP 126/75 | HR 76 | Temp 97.3°F | Ht 60.0 in | Wt 159.3 lb

## 2021-10-29 DIAGNOSIS — Z87898 Personal history of other specified conditions: Secondary | ICD-10-CM

## 2021-10-29 DIAGNOSIS — E66811 Obesity, class 1: Secondary | ICD-10-CM | POA: Insufficient documentation

## 2021-10-29 DIAGNOSIS — Z113 Encounter for screening for infections with a predominantly sexual mode of transmission: Secondary | ICD-10-CM | POA: Insufficient documentation

## 2021-10-29 DIAGNOSIS — E6609 Other obesity due to excess calories: Secondary | ICD-10-CM | POA: Insufficient documentation

## 2021-10-29 DIAGNOSIS — Z6831 Body mass index (BMI) 31.0-31.9, adult: Secondary | ICD-10-CM

## 2021-10-29 DIAGNOSIS — Z8632 Personal history of gestational diabetes: Secondary | ICD-10-CM | POA: Insufficient documentation

## 2021-10-29 NOTE — Assessment & Plan Note (Signed)
Body mass index is 31.11 kg/m?. ?Discussed importance of healthy weight management ?Discussed diet and exercise ? ?

## 2021-10-29 NOTE — Assessment & Plan Note (Signed)
Recommend A1c testing at CPE given hx of DM ?Continue to recommend balanced, lower carb meals. Smaller meal size, adding snacks. Choosing water as drink of choice and increasing purposeful exercise. ? ?

## 2021-10-29 NOTE — Assessment & Plan Note (Signed)
Request for 3 month "routine STI screening" ?-no longer reports high risk behavior ?-denies symptoms ?-denies need for HIV and Hep C, blood draws today ?-not interested in contraceptives; encouraged barrier method to prevent STI, discussion of use of PREP to prevent HIV ?-has 28 year old daughter, would be OK if she got pregnant again ?

## 2021-10-29 NOTE — Assessment & Plan Note (Signed)
Remote hx of MJ use ?No acute concern ? ?

## 2021-11-22 ENCOUNTER — Other Ambulatory Visit: Payer: Self-pay | Admitting: Family Medicine

## 2021-11-22 DIAGNOSIS — A5901 Trichomonal vulvovaginitis: Secondary | ICD-10-CM

## 2021-11-22 DIAGNOSIS — B3731 Acute candidiasis of vulva and vagina: Secondary | ICD-10-CM

## 2021-11-22 LAB — URINE CYTOLOGY ANCILLARY ONLY
Bacterial Vaginitis-Urine: NEGATIVE
Candida Urine: NEGATIVE — AB
Candida Urine: POSITIVE — AB
Chlamydia: NEGATIVE
Comment: NEGATIVE
Comment: NEGATIVE
Comment: NORMAL
Neisseria Gonorrhea: NEGATIVE
Trichomonas: POSITIVE — AB

## 2021-11-22 MED ORDER — METRONIDAZOLE 500 MG PO TABS: 500.0000 mg | ORAL_TABLET | Freq: Two times a day (BID) | ORAL | 0 refills | Status: AC

## 2021-11-22 MED ORDER — FLUCONAZOLE 150 MG PO TABS: ORAL_TABLET | ORAL | 1 refills | Status: DC

## 2021-11-23 ENCOUNTER — Ambulatory Visit
Admission: EM | Admit: 2021-11-23 | Discharge: 2021-11-23 | Disposition: A | Discharge status: Home / Self Care | Payer: Medicaid Other | Attending: Emergency Medicine | Admitting: Emergency Medicine

## 2021-11-23 ENCOUNTER — Encounter: Payer: Self-pay | Admitting: Emergency Medicine

## 2021-11-23 DIAGNOSIS — Z113 Encounter for screening for infections with a predominantly sexual mode of transmission: Secondary | ICD-10-CM | POA: Diagnosis not present

## 2021-11-23 NOTE — ED Provider Notes (Signed)
?[facility] ? ? ? ?CSN: [id number] ?Arrival date & time: 11/23/21  0946 ? ? ?  ? ?History   ?Chief Complaint ?Chief Complaint  ?Patient presents with  ? STI Check  ? ? ?HPI ?[name] [name] is a 26 y.o. female.  Patient presents with request for STD testing.  She was seen at [facility] family practice on 10/29/2021; her test result came back yesterday and was positive for trichomonas and candida.  She states she does not have any symptoms and thinks these may be incorrect test results.  She denies vaginal discharge, pelvic pain, abdominal pain, dysuria, hematuria, flank pain, fever, chills, rash, lesions, or other symptoms.  She declines treatment today and just wants to be tested. ? ?The history is provided by the patient and medical records.  ? ?Past Medical History:  ?Diagnosis Date  ? Allergy   ? Gestational diabetes   ? ? ?Patient Active Problem List  ? Diagnosis Date Noted  ? Trichomonal vulvovaginitis 11/22/2021  ? Vaginal yeast infection 11/22/2021  ? Routine screening for STI (sexually transmitted infection) 10/29/2021  ? Class 1 obesity due to excess calories without serious comorbidity with body mass index (BMI) of 31.0 to 31.9 in adult 10/29/2021  ? History of gestational diabetes 10/29/2021  ? History of substance use 06/12/2018  ? ? ?Past Surgical History:  ?Procedure Laterality Date  ? MOUTH SURGERY    ? ? ?OB History   ? ? Gravida  ?1  ? Para  ?1  ? Term  ?1  ? Preterm  ?   ? AB  ?   ? Living  ?1  ?  ? ? SAB  ?   ? IAB  ?   ? Ectopic  ?   ? Multiple  ?0  ? Live Births  ?1  ?   ?  ?  ? ? ? ?Home Medications   ? ?Prior to Admission medications   ?Medication Sig Start Date End Date Taking? Authorizing Provider  ?fluconazole (DIFLUCAN) 150 MG tablet If symptoms extend beyond 4 days; recommend you repeat dose. 11/22/21   Gwyneth Sprout, FNP  ?metroNIDAZOLE (FLAGYL) 500 MG tablet Take 1 tablet (500 mg total) by mouth 2 (two) times daily for 7 days. 11/22/21 11/29/21  Gwyneth Sprout, FNP  ? ? ?Family  History ?Family History  ?Problem Relation Age of Onset  ? Hypertension Maternal Grandmother   ? Diabetes Other   ?     maternal great aunts and uncles  ? Cervical cancer Maternal Great-grandmother   ? ? ?Social History ?Social History  ? ?Tobacco Use  ? Smoking status: Never  ? Smokeless tobacco: Never  ?Vaping Use  ? Vaping Use: Never used  ?Substance Use Topics  ? Alcohol use: Never  ? Drug use: Not Currently  ?  Comment: UDS positive for [facility]J at [facility]  ? ? ? ?Allergies   ?Peanut-containing drug products ? ? ?Review of Systems ?Review of Systems  ?Constitutional:  Negative for chills and fever.  ?Gastrointestinal:  Negative for abdominal pain, nausea and vomiting.  ?Genitourinary:  Negative for dysuria, flank pain, hematuria, pelvic pain and vaginal discharge.  ?Skin:  Negative for color change and rash.  ?All other systems reviewed and are negative. ? ? ?Physical Exam ?Triage Vital Signs ?ED Triage Vitals  ?Enc Vitals Group  ?   BP   ?   Pulse   ?   Resp   ?   Temp   ?  Temp src   ?   SpO2   ?   Weight   ?   Height   ?   Head Circumference   ?   Peak Flow   ?   Pain Score   ?   Pain Loc   ?   Pain Edu?   ?   Excl. in [place]?   ? ?No data found. ? ?Updated Vital Signs ?BP 127/84   Pulse 87   Temp 98.4 ?F (36.9 ?C)   Resp 18   SpO2 99%  ? ?Visual Acuity ?Right Eye Distance:   ?Left Eye Distance:   ?Bilateral Distance:   ? ?Right Eye Near:   ?Left Eye Near:    ?Bilateral Near:    ? ?Physical Exam ?Vitals and nursing note reviewed.  ?Constitutional:   ?   General: She is not in acute distress. ?   Appearance: She is well-developed. She is not ill-appearing.  ?HENT:  ?   Mouth/Throat:  ?   Mouth: Mucous membranes are moist.  ?Cardiovascular:  ?   Rate and Rhythm: Normal rate and regular rhythm.  ?   Heart sounds: Normal heart sounds.  ?Pulmonary:  ?   Effort: Pulmonary effort is normal. No respiratory distress.  ?   Breath sounds: Normal breath sounds.  ?Abdominal:  ?   Palpations: Abdomen is soft.  ?   Tenderness:  There is no abdominal tenderness. There is no right CVA tenderness, left CVA tenderness, guarding or rebound.  ?Musculoskeletal:  ?   Cervical back: Neck supple.  ?Skin: ?   General: Skin is warm and dry.  ?Neurological:  ?   Mental Status: She is alert.  ?Psychiatric:     ?   Mood and Affect: Mood normal.     ?   Behavior: Behavior normal.  ? ? ? ?UC Treatments / Results  ?Labs ?(all labs ordered are listed, but only abnormal results are displayed) ?Labs Reviewed  ?CERVICOVAGINAL ANCILLARY ONLY  ? ? ?EKG ? ? ?Radiology ?No results found. ? ?Procedures ?Procedures (including critical care time) ? ?Medications Ordered in UC ?Medications - No data to display ? ?Initial Impression / Assessment and Plan / UC Course  ?I have reviewed the triage vital signs and the nursing notes. ? ?Pertinent labs & imaging results that were available during my care of the patient were reviewed by me and considered in my medical decision making (see chart for details). ? ?Screening for STDs.  Patient is asymptomatic and declines treatment today.  She received results from another clinic yesterday that showed trichomonas and yeast but patient thinks these results are incorrect because it has been several weeks since she gave the specimen and she is asymptomatic.  Patient obtained vaginal self swab for testing.  Discussed that we will call if test results are positive.  Discussed that she may require treatment at that time.  Discussed that sexual partner(s) may also require treatment.  Instructed patient to abstain from sexual activity for at least 7 days.  Instructed her to follow-up with her PCP or gynecologist as needed.  Patient agrees to plan of care.  ? ? ?Final Clinical Impressions(s) / UC Diagnoses  ? ?Final diagnoses:  ?Screening for STD (sexually transmitted disease)  ? ? ? ?Discharge Instructions   ? ?  ?Your vaginal tests are pending.  If your test results are positive, we will call you.  You and your sexual partner(s) may  require treatment at that time.  Do not  have sexual activity for at least 7 days.   ? ?Follow up with your primary care provider if your symptoms are not improving.   ? ? ? ? ? ?ED Prescriptions   ?None ?  ? ?PDMP not reviewed this encounter. ?  ?Sharion Balloon, NP ?11/23/21 1124 ? ?

## 2021-11-23 NOTE — ED Triage Notes (Signed)
Pt was seen at [facility] on 3/10 when they did a cytology swab. The swab did not result until yesterday and showed Trichomonas positive. Pt has no sx.  ?

## 2021-11-23 NOTE — Discharge Instructions (Addendum)
Your vaginal tests are pending.  If your test results are positive, we will call you.  You and your sexual partner(s) may require treatment at that time.  Do not have sexual activity for at least 7 days.    Follow up with your primary care provider if your symptoms are not improving.    

## 2021-11-24 ENCOUNTER — Telehealth (HOSPITAL_COMMUNITY): Payer: Self-pay | Admitting: Emergency Medicine

## 2021-11-24 LAB — CERVICOVAGINAL ANCILLARY ONLY
Bacterial Vaginitis (gardnerella): POSITIVE — AB
Candida Glabrata: NEGATIVE
Candida Vaginitis: POSITIVE — AB
Chlamydia: NEGATIVE
Comment: NEGATIVE
Comment: NEGATIVE
Comment: NEGATIVE
Comment: NEGATIVE
Comment: NEGATIVE
Comment: NORMAL
Neisseria Gonorrhea: NEGATIVE
Trichomonas: POSITIVE — AB

## 2022-10-06 DIAGNOSIS — Z0389 Encounter for observation for other suspected diseases and conditions ruled out: Secondary | ICD-10-CM | POA: Diagnosis not present

## 2022-11-24 ENCOUNTER — Ambulatory Visit
Admission: EM | Admit: 2022-11-24 | Discharge: 2022-11-24 | Disposition: A | Discharge status: Home / Self Care | Payer: Medicaid Other | Attending: Physician Assistant | Admitting: Physician Assistant

## 2022-11-24 DIAGNOSIS — Z113 Encounter for screening for infections with a predominantly sexual mode of transmission: Secondary | ICD-10-CM | POA: Insufficient documentation

## 2022-11-24 NOTE — ED Triage Notes (Signed)
Patient to Urgent Care for STD testing. Denies any known symptoms or known exposure.  Also requests blood work.

## 2022-11-24 NOTE — Discharge Instructions (Signed)
Monitor your MyChart for results.  We will contact you if you are positive for anything we need to arrange treatment.  If you develop any symptoms including pelvic pain, abdominal pain, fever, nausea, vomiting, vaginal discharge you should be seen again.  Use a condom with each sexual encounter.  Abstain from sex until you receive your results.

## 2022-11-24 NOTE — ED Provider Notes (Signed)
[name]    CSN: [id number] Arrival date & time: 11/24/22  1043      History   Chief Complaint Chief Complaint  Patient presents with   SEXUALLY TRANSMITTED DISEASE    HPI [name] [name] is a 27 y.o. female.   Patient presents today for STI screening.  She denies any known symptoms including abdominal pain, pelvic pain, fever, nausea, vomiting, vaginal discharge.  She has had sexually transmitted infection in the past but reports completing treatment and having no ongoing symptoms.  She is sexually active with female partners and typically uses condoms.  Denies any known exposure but is interested in complete STI panel.  Denies any recent antibiotics.  She is confident that she is not pregnant.    Past Medical History:  Diagnosis Date   Allergy    Gestational diabetes     Patient Active Problem List   Diagnosis Date Noted   Trichomonal vulvovaginitis 11/22/2021   Vaginal yeast infection 11/22/2021   Routine screening for STI (sexually transmitted infection) 10/29/2021   Class 1 obesity due to excess calories without serious comorbidity with body mass index (BMI) of 31.0 to 31.9 in adult 10/29/2021   History of gestational diabetes 10/29/2021   History of substance use 06/12/2018    Past Surgical History:  Procedure Laterality Date   MOUTH SURGERY      OB History     Gravida  1   Para  1   Term  1   Preterm      AB      Living  1      SAB      IAB      Ectopic      Multiple  0   Live Births  1            Home Medications    Prior to Admission medications   Medication Sig Start Date End Date Taking? Authorizing Provider  fluconazole (DIFLUCAN) 150 MG tablet If symptoms extend beyond 4 days; recommend you repeat dose. Patient not taking: Reported on 11/24/2022 11/22/21   Gwyneth Sprout, FNP    Family History Family History  Problem Relation Age of Onset   Hypertension Maternal Grandmother    Diabetes Other        maternal  great aunts and uncles   Cervical cancer Maternal Great-grandmother     Social History Social History   Tobacco Use   Smoking status: Never   Smokeless tobacco: Never  Vaping Use   Vaping Use: Never used  Substance Use Topics   Alcohol use: Never   Drug use: Not Currently    Comment: UDS positive for MJ at [place] [facility]   Review of Systems Review of Systems  Constitutional:  Negative for activity change, appetite change, fatigue and fever.  Gastrointestinal:  Negative for abdominal pain, diarrhea, nausea and vomiting.  Genitourinary:  Negative for dysuria, frequency, pelvic pain, urgency, vaginal bleeding, vaginal discharge and vaginal pain.     Physical Exam Triage Vital Signs ED Triage Vitals  Enc Vitals Group     BP 11/24/22 1131 103/71     Pulse Rate 11/24/22 1131 86     Resp 11/24/22 1131 18     Temp 11/24/22 1131 97.9 F (36.6 C)     Temp src --      SpO2 11/24/22 1131 98 %     Weight 11/24/22 1129 155 lb (70.3  kg)     Height 11/24/22 1129 5' (1.524 m)     Head Circumference --      Peak Flow --      Pain Score 11/24/22 1129 0     Pain Loc --      Pain Edu? --      Excl. in [place]? --    No data found.  Updated Vital Signs BP 103/71   Pulse 86   Temp 97.9 F (36.6 C)   Resp 18   Ht 5' (1.524 m)   Wt 155 lb (70.3 kg)   LMP 10/28/2022   SpO2 98%   BMI 30.27 kg/m   Visual Acuity Right Eye Distance:   Left Eye Distance:   Bilateral Distance:    Right Eye Near:   Left Eye Near:    Bilateral Near:     Physical Exam Vitals reviewed.  Constitutional:      General: She is awake. She is not in acute distress.    Appearance: Normal appearance. She is well-developed. She is not ill-appearing.     Comments: Very pleasant female appears stated age in no acute distress sitting comfortably in exam room  HENT:     Head: Normocephalic and atraumatic.  Cardiovascular:     Rate and Rhythm: Normal rate and regular  rhythm.     Heart sounds: Normal heart sounds, S1 normal and S2 normal. No murmur heard. Pulmonary:     Effort: Pulmonary effort is normal.     Breath sounds: Normal breath sounds. No wheezing, rhonchi or rales.  Abdominal:     General: Bowel sounds are normal.     Palpations: Abdomen is soft.     Tenderness: There is no abdominal tenderness. There is no right CVA tenderness, left CVA tenderness, guarding or rebound.  Psychiatric:        Behavior: Behavior is cooperative.      UC Treatments / Results  Labs (all labs ordered are listed, but only abnormal results are displayed) Labs Reviewed  RPR  HIV ANTIBODY (ROUTINE TESTING W REFLEX)  CERVICOVAGINAL ANCILLARY ONLY    EKG   Radiology No results found.  Procedures Procedures (including critical care time)  Medications Ordered in UC Medications - No data to display  Initial Impression / Assessment and Plan / UC Course  I have reviewed the triage vital signs and the nursing notes.  Pertinent labs & imaging results that were available during my care of the patient were reviewed by me and considered in my medical decision making (see chart for details).     Patient is well-appearing, afebrile, nontoxic, nontachycardic.  Vital signs and physical exam are reassuring today.  She denies any concern for pregnancy.  STI swab was collected and we will contact her if need to arrange any treatment.  Treatment was deferred as she is currently asymptomatic.  Blood testing for HIV, syphilis obtained today and is pending.  Discussed importance of safe sex practices.  If she develops any symptoms she is to return for reevaluation.  Final Clinical Impressions(s) / UC Diagnoses   Final diagnoses:  Screening examination for STI     Discharge Instructions      Monitor your MyChart for results.  We will contact you if you are positive for anything we need to arrange treatment.  If you develop any symptoms including pelvic pain,  abdominal pain, fever, nausea, vomiting, vaginal discharge you should be seen again.  Use a condom with each sexual encounter.  Abstain from sex until you receive your results.    ED Prescriptions   None    PDMP not reviewed this encounter.   Terrilee Croak, PA-C 11/24/22 1146

## 2022-11-25 LAB — CERVICOVAGINAL ANCILLARY ONLY
Bacterial Vaginitis (gardnerella): POSITIVE — AB
Candida Glabrata: NEGATIVE
Candida Vaginitis: NEGATIVE
Chlamydia: NEGATIVE
Comment: NEGATIVE
Comment: NEGATIVE
Comment: NEGATIVE
Comment: NEGATIVE
Comment: NEGATIVE
Comment: NORMAL
Neisseria Gonorrhea: NEGATIVE
Trichomonas: POSITIVE — AB

## 2022-11-25 LAB — RPR: RPR Ser Ql: NONREACTIVE

## 2022-11-25 LAB — HIV ANTIBODY (ROUTINE TESTING W REFLEX): HIV Screen 4th Generation wRfx: NONREACTIVE

## 2022-11-28 ENCOUNTER — Telehealth (HOSPITAL_COMMUNITY): Payer: Self-pay | Admitting: Emergency Medicine

## 2022-11-28 MED ORDER — METRONIDAZOLE 500 MG PO TABS: 500.0000 mg | ORAL_TABLET | Freq: Two times a day (BID) | ORAL | 0 refills | Status: DC

## 2022-12-22 ENCOUNTER — Ambulatory Visit
Admission: EM | Admit: 2022-12-22 | Discharge: 2022-12-22 | Disposition: A | Discharge status: Home / Self Care | Payer: Medicaid Other | Attending: Emergency Medicine | Admitting: Emergency Medicine

## 2022-12-22 DIAGNOSIS — Z3202 Encounter for pregnancy test, result negative: Secondary | ICD-10-CM

## 2022-12-22 LAB — POCT URINE PREGNANCY: Preg Test, Ur: NEGATIVE

## 2022-12-22 NOTE — Discharge Instructions (Addendum)
Declined avs 

## 2022-12-22 NOTE — ED Triage Notes (Signed)
Patient presents to UC for pregnancy test. She reports she has missed her cycle and today makes day 23. Unprotected intercourse a month ago. She has had 4 negative rapid home tests. No concern for STDs.

## 2022-12-22 NOTE — ED Provider Notes (Signed)
[facility]    CSN: [id number] Arrival date & time: 12/22/22  1011      History   Chief Complaint Chief Complaint  Patient presents with   Possible Pregnancy    HPI [name] [name] is a 27 y.o. female.   Patient presents for evaluation requesting pregnancy test due to 23 days late for her menstruation.  Last known menstrual period 10/28/2022.  Sexually active, no condom use, no birth control method.  Denies concern for STD and denies all urinary and vaginal symptoms.  Has taken 4 home tests which have all been negative.  Endorses increased stress.  Past Medical History:  Diagnosis Date   Allergy    Gestational diabetes     Patient Active Problem List   Diagnosis Date Noted   Trichomonal vulvovaginitis 11/22/2021   Vaginal yeast infection 11/22/2021   Routine screening for STI (sexually transmitted infection) 10/29/2021   Class 1 obesity due to excess calories without serious comorbidity with body mass index (BMI) of 31.0 to 31.9 in adult 10/29/2021   History of gestational diabetes 10/29/2021   History of substance use 06/12/2018    Past Surgical History:  Procedure Laterality Date   MOUTH SURGERY      OB History     Gravida  1   Para  1   Term  1   Preterm      AB      Living  1      SAB      IAB      Ectopic      Multiple  0   Live Births  1            Home Medications    Prior to Admission medications   Medication Sig Start Date End Date Taking? Authorizing Provider  fluconazole (DIFLUCAN) 150 MG tablet If symptoms extend beyond 4 days; recommend you repeat dose. Patient not taking: Reported on 11/24/2022 11/22/21   Jacky Kindle, FNP  metroNIDAZOLE (FLAGYL) 500 MG tablet Take 1 tablet (500 mg total) by mouth 2 (two) times daily. 11/28/22   LampteyBritta Mccreedy, MD    Family History Family History  Problem Relation Age of Onset   Hypertension Maternal Grandmother    Diabetes Other        maternal great aunts and uncles    Cervical cancer Maternal Great-grandmother     Social History Social History   Tobacco Use   Smoking status: Never   Smokeless tobacco: Never  Vaping Use   Vaping Use: Never used  Substance Use Topics   Alcohol use: Never   Drug use: Not Currently    Comment: UDS positive for MJ at [facility]     Allergies   Peanut-containing drug products   Review of Systems Review of Systems  Constitutional: Negative.   HENT: Negative.    Respiratory: Negative.    Cardiovascular: Negative.   Genitourinary: Negative.      Physical Exam Triage Vital Signs ED Triage Vitals  Enc Vitals Group     BP 12/22/22 1032 122/68     Pulse Rate 12/22/22 1032 80     Resp 12/22/22 1032 16     Temp 12/22/22 1032 97.8 F (36.6 C)     Temp Source 12/22/22 1032 Temporal     SpO2 12/22/22 1032 98 %     Weight --      Height --      Head Circumference --  Peak Flow --      Pain Score 12/22/22 1017 0     Pain Loc --      Pain Edu? --      Excl. in [place]? --    No data found.  Updated Vital Signs BP 122/68 (BP Location: Left Arm)   Pulse 80   Temp 97.8 F (36.6 C) (Temporal)   Resp 16   LMP 10/28/2022   SpO2 98%   Visual Acuity Right Eye Distance:   Left Eye Distance:   Bilateral Distance:    Right Eye Near:   Left Eye Near:    Bilateral Near:     Physical Exam Constitutional:      Appearance: Normal appearance.  Eyes:     Extraocular Movements: Extraocular movements intact.  Pulmonary:     Effort: Pulmonary effort is normal.  Neurological:     Mental Status: She is alert and oriented to person, place, and time. Mental status is at baseline.      UC Treatments / Results  Labs (all labs ordered are listed, but only abnormal results are displayed) Labs Reviewed  POCT URINE PREGNANCY    EKG   Radiology No results found.  Procedures Procedures (including critical care time)  Medications Ordered in UC Medications - No data to display  Initial Impression /  Assessment and Plan / UC Course  I have reviewed the triage vital signs and the nursing notes.  Pertinent labs & imaging results that were available during my care of the patient were reviewed by me and considered in my medical decision making (see chart for details).  Negative pregnancy test  Urine pregnancy negative, possibly delayed due to stress, discussed with patient, advised to monitor closely and use some for of protection to prevent unwanted pregnancy, advised home testing for pregnancy if menstruation does not occur for the month of May Final Clinical Impressions(s) / UC Diagnoses   Final diagnoses:  None   Discharge Instructions   None    ED Prescriptions   None    PDMP not reviewed this encounter.   Valinda Hoar, NP 12/22/22 1046

## 2023-02-06 ENCOUNTER — Encounter: Payer: Self-pay | Admitting: Emergency Medicine

## 2023-02-06 ENCOUNTER — Ambulatory Visit
Admission: EM | Admit: 2023-02-06 | Discharge: 2023-02-06 | Disposition: A | Discharge status: Home / Self Care | Payer: Medicaid Other | Attending: Urgent Care | Admitting: Urgent Care

## 2023-02-06 ENCOUNTER — Other Ambulatory Visit: Payer: Self-pay

## 2023-02-06 DIAGNOSIS — Z113 Encounter for screening for infections with a predominantly sexual mode of transmission: Secondary | ICD-10-CM | POA: Diagnosis not present

## 2023-02-06 NOTE — ED Triage Notes (Signed)
Patient wants to be evaluated.  A few nights ago had unprotected sex.[name] [name]  denies any symptoms

## 2023-02-06 NOTE — Discharge Instructions (Signed)
Results of today's testing will be posted to your MyChart account.  If there is a result requiring treatment, someone will contact you.

## 2023-02-06 NOTE — ED Provider Notes (Signed)
[facility]    CSN: [id number] Arrival date & time: 02/06/23  1208      History   Chief Complaint Chief Complaint  Patient presents with   SEXUALLY TRANSMITTED DISEASE    HPI KINGSTON MCCRITE is a 27 y.o. female.   HPI  Patient presents to urgent care requesting STD screening following unprotected sex.  Denies any symptoms.  Past Medical History:  Diagnosis Date   Allergy    Gestational diabetes     Patient Active Problem List   Diagnosis Date Noted   Trichomonal vulvovaginitis 11/22/2021   Vaginal yeast infection 11/22/2021   Routine screening for STI (sexually transmitted infection) 10/29/2021   Class 1 obesity due to excess calories without serious comorbidity with body mass index (BMI) of 31.0 to 31.9 in adult 10/29/2021   History of gestational diabetes 10/29/2021   History of substance use 06/12/2018    Past Surgical History:  Procedure Laterality Date   MOUTH SURGERY      OB History     Gravida  1   Para  1   Term  1   Preterm      AB      Living  1      SAB      IAB      Ectopic      Multiple  0   Live Births  1            Home Medications    Prior to Admission medications   Medication Sig Start Date End Date Taking? Authorizing Provider  fluconazole (DIFLUCAN) 150 MG tablet If symptoms extend beyond 4 days; recommend you repeat dose. Patient not taking: Reported on 11/24/2022 11/22/21   Jacky Kindle, FNP  metroNIDAZOLE (FLAGYL) 500 MG tablet Take 1 tablet (500 mg total) by mouth 2 (two) times daily. Patient not taking: Reported on 02/06/2023 11/28/22   Merrilee Jansky, MD    Family History Family History  Problem Relation Age of Onset   Hypertension Maternal Grandmother    Cervical cancer Maternal Great-grandmother    Diabetes Other        maternal great aunts and uncles    Social History Social History   Tobacco Use   Smoking status: Never   Smokeless tobacco: Never  Vaping Use   Vaping Use: Never used   Substance Use Topics   Alcohol use: Never   Drug use: Not Currently    Comment: UDS positive for [facility] at [facility]     Allergies   Peanut-containing drug products   Review of Systems Review of Systems   Physical Exam Triage Vital Signs ED Triage Vitals  Enc Vitals Group     BP 02/06/23 1317 117/71     Pulse Rate 02/06/23 1317 96     Resp 02/06/23 1317 18     Temp 02/06/23 1317 98.3 F (36.8 C)     Temp Source 02/06/23 1317 Oral     SpO2 02/06/23 1317 97 %     Weight --      Height --      Head Circumference --      Peak Flow --      Pain Score 02/06/23 1315 0     Pain Loc --      Pain Edu? --      Excl. in [place]? --    No data found.  Updated Vital Signs BP 117/71 (BP Location: Right Arm)   Pulse 96  Temp 98.3 F (36.8 C) (Oral)   Resp 18   LMP 12/22/2022   SpO2 97%   Visual Acuity Right Eye Distance:   Left Eye Distance:   Bilateral Distance:    Right Eye Near:   Left Eye Near:    Bilateral Near:     Physical Exam Vitals reviewed.  Constitutional:      Appearance: Normal appearance.  Skin:    General: Skin is warm and dry.  Neurological:     General: No focal deficit present.     Mental Status: She is alert and oriented to person, place, and time.  Psychiatric:        Mood and Affect: Mood normal.        Behavior: Behavior normal.      UC Treatments / Results  Labs (all labs ordered are listed, but only abnormal results are displayed) Labs Reviewed  HIV ANTIBODY (ROUTINE TESTING W REFLEX)  RPR  CERVICOVAGINAL ANCILLARY ONLY    EKG   Radiology No results found.  Procedures Procedures (including critical care time)  Medications Ordered in UC Medications - No data to display  Initial Impression / Assessment and Plan / UC Course  I have reviewed the triage vital signs and the nursing notes.  Pertinent labs & imaging results that were available during my care of the patient were reviewed by me and considered in my medical decision  making (see chart for details).   TWALLA RAPPOLD is a 27 y.o. female presenting with asymtomatic. Patient is afebrile without recent antipyretics, satting well on room air. Overall is well appearing, well hydrated, without respiratory distress.  Reviewed relevant chart history.   Vaginal self swab was obtained and blood draw for additional screening.    Final Clinical Impressions(s) / UC Diagnoses   Final diagnoses:  Screen for STD (sexually transmitted disease)   Discharge Instructions   None    ED Prescriptions   None    PDMP not reviewed this encounter.   Charma Igo, [place] 02/06/23 1329

## 2023-02-07 ENCOUNTER — Telehealth: Payer: Self-pay | Admitting: Emergency Medicine

## 2023-02-07 LAB — CERVICOVAGINAL ANCILLARY ONLY
Bacterial Vaginitis (gardnerella): POSITIVE — AB
Candida Glabrata: NEGATIVE
Candida Vaginitis: POSITIVE — AB
Chlamydia: NEGATIVE
Comment: NEGATIVE
Comment: NEGATIVE
Comment: NEGATIVE
Comment: NEGATIVE
Comment: NEGATIVE
Comment: NORMAL
Neisseria Gonorrhea: NEGATIVE
Trichomonas: NEGATIVE

## 2023-02-07 LAB — RPR: RPR Ser Ql: NONREACTIVE

## 2023-02-07 LAB — HIV ANTIBODY (ROUTINE TESTING W REFLEX): HIV Screen 4th Generation wRfx: NONREACTIVE

## 2023-02-07 MED ORDER — METRONIDAZOLE 500 MG PO TABS: 500.0000 mg | ORAL_TABLET | Freq: Two times a day (BID) | ORAL | 0 refills | Status: DC

## 2023-02-07 MED ORDER — FLUCONAZOLE 150 MG PO TABS: 150.0000 mg | ORAL_TABLET | Freq: Once | ORAL | 0 refills | Status: AC

## 2023-04-12 DIAGNOSIS — Z111 Encounter for screening for respiratory tuberculosis: Secondary | ICD-10-CM | POA: Diagnosis not present

## 2023-04-14 DIAGNOSIS — Z111 Encounter for screening for respiratory tuberculosis: Secondary | ICD-10-CM | POA: Diagnosis not present

## 2023-04-14 DIAGNOSIS — Z0279 Encounter for issue of other medical certificate: Secondary | ICD-10-CM | POA: Diagnosis not present

## 2024-01-16 ENCOUNTER — Emergency Department
Admission: EM | Admit: 2024-01-16 | Discharge: 2024-01-16 | Disposition: A | Discharge status: Home / Self Care | Attending: Emergency Medicine | Admitting: Emergency Medicine

## 2024-01-16 ENCOUNTER — Other Ambulatory Visit: Payer: Self-pay

## 2024-01-16 ENCOUNTER — Emergency Department

## 2024-01-16 DIAGNOSIS — O3680X0 Pregnancy with inconclusive fetal viability, not applicable or unspecified: Secondary | ICD-10-CM

## 2024-01-16 DIAGNOSIS — Z3A01 Less than 8 weeks gestation of pregnancy: Secondary | ICD-10-CM | POA: Diagnosis not present

## 2024-01-16 DIAGNOSIS — Z3A09 9 weeks gestation of pregnancy: Secondary | ICD-10-CM | POA: Insufficient documentation

## 2024-01-16 DIAGNOSIS — O3481 Maternal care for other abnormalities of pelvic organs, first trimester: Secondary | ICD-10-CM | POA: Diagnosis not present

## 2024-01-16 DIAGNOSIS — O209 Hemorrhage in early pregnancy, unspecified: Secondary | ICD-10-CM | POA: Diagnosis not present

## 2024-01-16 DIAGNOSIS — O26851 Spotting complicating pregnancy, first trimester: Secondary | ICD-10-CM | POA: Diagnosis present

## 2024-01-16 DIAGNOSIS — O26891 Other specified pregnancy related conditions, first trimester: Secondary | ICD-10-CM | POA: Diagnosis not present

## 2024-01-16 LAB — COMPREHENSIVE METABOLIC PANEL WITH GFR
ALT: 16 U/L (ref 0–44)
AST: 17 U/L (ref 15–41)
Albumin: 4.1 g/dL (ref 3.5–5.0)
Alkaline Phosphatase: 63 U/L (ref 38–126)
Anion gap: 11 (ref 5–15)
BUN: 10 mg/dL (ref 6–20)
CO2: 21 mmol/L — ABNORMAL LOW (ref 22–32)
Calcium: 9.1 mg/dL (ref 8.9–10.3)
Chloride: 104 mmol/L (ref 98–111)
Creatinine, Ser: 0.88 mg/dL (ref 0.44–1.00)
GFR, Estimated: >60 mL/min (ref >60)
Glucose, Bld: 93 mg/dL (ref 70–99)
Potassium: 3.6 mmol/L (ref 3.5–5.1)
Sodium: 136 mmol/L (ref 135–145)
Total Bilirubin: 0.3 mg/dL (ref 0.0–1.2)
Total Protein: 7.6 g/dL (ref 6.5–8.1)

## 2024-01-16 LAB — CBC WITH DIFFERENTIAL/PLATELET
Abs Immature Granulocytes: 0.06 10*3/uL (ref 0.00–0.07)
Basophils Absolute: 0.1 10*3/uL (ref 0.0–0.1)
Basophils Relative: 0 %
Eosinophils Absolute: 0.1 10*3/uL (ref 0.0–0.5)
Eosinophils Relative: 0 %
HCT: 42.9 % (ref 36.0–46.0)
Hemoglobin: 14.2 g/dL (ref 12.0–15.0)
Immature Granulocytes: 0 %
Lymphocytes Relative: 19 %
Lymphs Abs: 2.9 10*3/uL (ref 0.7–4.0)
MCH: 28.5 pg (ref 26.0–34.0)
MCHC: 33.1 g/dL (ref 30.0–36.0)
MCV: 86 fL (ref 80.0–100.0)
Monocytes Absolute: 0.9 10*3/uL (ref 0.1–1.0)
Monocytes Relative: 6 %
Neutro Abs: 10.9 10*3/uL — ABNORMAL HIGH (ref 1.7–7.7)
Neutrophils Relative %: 75 %
Platelets: 330 10*3/uL (ref 150–400)
RBC: 4.99 MIL/uL (ref 3.87–5.11)
RDW: 13.6 % (ref 11.5–15.5)
WBC: 14.9 10*3/uL — ABNORMAL HIGH (ref 4.0–10.5)
nRBC: 0 % (ref 0.0–0.2)

## 2024-01-16 LAB — ABO/RH: ABO/RH(D): O POS

## 2024-01-16 LAB — HCG, QUANTITATIVE, PREGNANCY: hCG, Beta Chain, Quant, S: 7363 m[IU]/mL — ABNORMAL HIGH (ref <5)

## 2024-01-16 NOTE — ED Provider Notes (Signed)
 [facility] Provider Note    Event Date/Time   First MD Initiated Contact with Patient 01/16/24 1240     (approximate)   History   possible ectopic pregnancy   HPI  [name] [name] is a 28 y.o. female G2, P1 currently estimated to be 8 to [redacted] weeks pregnant who presents today for evaluation of vaginal spotting.  She reports that this began the last couple days.  She reports that she went to her OB/GYN for an abortion today and they sent her to the emergency department for possible ectopic pregnancy.  Patient denies any significant pain, reports that she has very mild suprapubic pain.  No unilateral pain.  She denies any vaginal discharge, denies concern for STDs, does not wish to be treated for STDs.  Patient Active Problem List   Diagnosis Date Noted   Trichomonal vulvovaginitis 11/22/2021   Vaginal yeast infection 11/22/2021   Routine screening for STI (sexually transmitted infection) 10/29/2021   Class 1 obesity due to excess calories without serious comorbidity with body mass index (BMI) of 31.0 to 31.9 in adult 10/29/2021   History of gestational diabetes 10/29/2021   History of substance use 06/12/2018          Physical Exam   Triage Vital Signs: ED Triage Vitals  Encounter Vitals Group     BP 01/16/24 1158 (!) 148/90     Systolic BP Percentile --      Diastolic BP Percentile --      Pulse Rate 01/16/24 1158 99     Resp 01/16/24 1158 18     Temp 01/16/24 1158 98.4 F (36.9 C)     Temp Source 01/16/24 1158 Oral     SpO2 01/16/24 1158 100 %     Weight 01/16/24 1202 168 lb (76.2 kg)     Height 01/16/24 1202 5' (1.524 m)     Head Circumference --      Peak Flow --      Pain Score 01/16/24 1201 0     Pain Loc --      Pain Education --      Exclude from Growth Chart --     Most recent vital signs: Vitals:   01/16/24 1158 01/16/24 1720  BP: (!) 148/90 (!) 112/56  Pulse: 99 84  Resp: 18 18  Temp: 98.4 F (36.9 C) 98.3 F (36.8 C)   SpO2: 100% 98%    Physical Exam Vitals and nursing note reviewed.  Constitutional:      General: Awake and alert. No acute distress.    Appearance: Normal appearance. The patient is normal weight.  HENT:     Head: Normocephalic and atraumatic.     Mouth: Mucous membranes are moist.  Eyes:     General: PERRL. Normal EOMs        Right eye: No discharge.        Left eye: No discharge.     Conjunctiva/sclera: Conjunctivae normal.  Cardiovascular:     Rate and Rhythm: Normal rate and regular rhythm.     Pulses: Normal pulses.  Pulmonary:     Effort: Pulmonary effort is normal. No respiratory distress.     Breath sounds: Normal breath sounds.  Abdominal:     Abdomen is soft. There is no abdominal tenderness. No rebound or guarding. No distention. Musculoskeletal:        General: No swelling. Normal range of motion.     Cervical back: Normal range of motion and  neck supple.  Skin:    General: Skin is warm and dry.     Capillary Refill: Capillary refill takes less than 2 seconds.     Findings: No rash.  Neurological:     Mental Status: The patient is awake and alert.      ED Results / Procedures / Treatments   Labs (all labs ordered are listed, but only abnormal results are displayed) Labs Reviewed  CBC WITH DIFFERENTIAL/PLATELET - Abnormal; Notable for the following components:      Result Value   WBC 14.9 (*)    Neutro Abs 10.9 (*)    All other components within normal limits  COMPREHENSIVE METABOLIC PANEL WITH GFR - Abnormal; Notable for the following components:   CO2 21 (*)    All other components within normal limits  HCG, QUANTITATIVE, PREGNANCY - Abnormal; Notable for the following components:   hCG, Beta Chain, Quant, S 7,363 (*)    All other components within normal limits  ABO/RH     EKG     RADIOLOGY I independently reviewed and interpreted imaging and agree with radiologists findings.     PROCEDURES:  Critical Care performed:    Procedures   MEDICATIONS ORDERED IN ED: Medications - No data to display   IMPRESSION / MDM / ASSESSMENT AND PLAN / ED COURSE  I reviewed the triage vital signs and the nursing notes.   Differential diagnosis includes, but is not limited to, ectopic pregnancy, early pregnancy, subchorionic hemorrhage, spontaneous abortion.  Patient is awake and alert, hemodynamically stable and afebrile.  She is nontoxic in appearance.  She has no abdominal pain today.  Further workup is indicated.  Labs obtained reveal a hCG of 7363.  Subsequent ultrasound reveals none localization of the pregnancy, no intrauterine gestational sac or abnormal/suspicious ovarian/adnexal masses.  I consulted OB/GYN on-call and discussed with [facility] CNM who, recommends repeat hCG in 48 hours.  Unable to complete abortion today which patient understands.  She was given abortion clinic resources per her request.  She was given OB/GYN follow-up information to schedule the hCG recheck in 2 days, and understands that she may return to the emergency department if she is unable to obtain an appointment.  We also discussed strict ectopic return precautions as well.  Patient understands and agrees with plan.  She was discharged in stable condition.   Patient's presentation is most consistent with acute complicated illness / injury requiring diagnostic workup.  Clinical Course as of 01/16/24 Quin Brush  Tue Jan 16, 2024  1723 OB/GYN consulted [JP]  1742 Consulted Emily Slaughterbeck CNM who recommends repeat hCG in 48 hours, no meds for possible ectopic versus spontaneous abortion [JP]    Clinical Course User Index [JP] Bernerd Terhune E, PA-C     FINAL CLINICAL IMPRESSION(S) / ED DIAGNOSES   Final diagnoses:  Pregnancy of unknown anatomic location     Rx / DC Orders   ED Discharge Orders     None        Note:  This document was prepared using Dragon voice recognition software and may include  unintentional dictation errors.   Zaryan Yakubov E, PA-C 01/16/24 Coye Diver, MD 01/17/24 [phone]

## 2024-01-16 NOTE — ED Triage Notes (Signed)
 Pt arrives via POV from [facility] where they were told they have a possible ectopic pregnancy. Pt is about 8-[redacted] weeks pregnant. Pt is spotting that started Saturday that has been minimal. Pt denies pain, SOB, CP. Pt is A&Ox4 and ambulatory during triage.

## 2024-01-16 NOTE — ED Notes (Signed)
Lab called to add on ABO/Rh

## 2024-01-16 NOTE — Discharge Instructions (Addendum)
 As we discussed, your pregnancy is not visualized in your uterus at this time.  It is possible that it is too early or it is possible that it is developing elsewhere.  It is very important that you get a repeat hCG blood test in 48 hours.  You may follow-up with the OB/GYN clinic provided or you may return to the emergency department if necessary.  Please return to the emergency department if you develop severe abdominal pain, or heavy vaginal bleeding, or any other concerns.  Finding out that you are pregnant when you had not intended to be can be unsettling. Many people find that even with all the talk about termination of pregnancy (also called abortion) in the media, they actually don't know much about it on a basic level. Having more information can help you make more informed decisions, and can also help you know that you are not alone when facing these decisions.  Below are some web sites with good clinical data about all aspects of unintended pregnancy and abortion.  Whatever you end up deciding, it is important to know that either way it is your choice. You get to make it. You get to decide what is right for your life, in this situation, at this time in your life. You can make thoughtful decisions and you will be ok, no matter what you decide. And maybe feel a little empowered at the end of all of it, too!   Some quick (and possibly surprising) facts: 1/2 of all pregnancies in the [place]  are unintended (yes, even these days, even though we have good contraceptive options and in general expect to be in control of our own bodies, half of all pregnancies!! This means that you are NOT ALONE.) Of the unintended pregnancies, about 1/3 to 1/2 end in termination. The overall abortion rate in the [place]  is going down (possibly because of more effective contraception options) 1 out of 3 American women will need and have an abortion in her lifetime.    Resources:   [employer] of [employer]stetricians and  Gynecologists:  https://www.boyd-meyer.org/  [facility] (Women's health care provider) Https://www.plannedparenthood.org/learn/abortion  National [employer] blow[employer]designer (Professional association of abortion providers) Good FAQs available in Web designer. Abortion providers in your area can be found here. StickerEmporium.tn  [facility]: [phone] (goes up to 12 wks in keeping with the new [place] law)  [facility]: [phone] (goes up to 12 wks in keeping with the new [place] law)   From [employer]:  [facility] [place], [place], [place] [phone] https://awcg[place].com  [facility] [place], [place] [phone] www.[facility].com  Online: https://www.plancpills.org/guide-how-to-get-abortion-pills I Need an Abortion  ineedana.com

## 2024-01-18 ENCOUNTER — Other Ambulatory Visit

## 2024-01-18 ENCOUNTER — Other Ambulatory Visit: Payer: Self-pay

## 2024-01-18 DIAGNOSIS — O3680X0 Pregnancy with inconclusive fetal viability, not applicable or unspecified: Secondary | ICD-10-CM

## 2024-01-18 NOTE — Progress Notes (Signed)
 Beta Hcg Order placed Per 01/16/24 ED Note: I consulted OB/GYN on-call and discussed with [facility] CNM who, recommends repeat hCG in 48 hours.  Unable to complete abortion today which patient understands.  She was given abortion clinic resources per her request.  She was given OB/GYN follow-up information to schedule the hCG recheck in 2 days, and understands that she may return to the emergency department if she is unable to obtain an appointment.  We also discussed strict ectopic return precautions as well.  Patient understands and agrees with plan.  She was discharged in stable condition.

## 2024-01-19 ENCOUNTER — Ambulatory Visit: Payer: Self-pay | Admitting: Certified Nurse Midwife

## 2024-01-19 ENCOUNTER — Other Ambulatory Visit: Payer: Self-pay | Admitting: Certified Nurse Midwife

## 2024-01-19 DIAGNOSIS — O039 Complete or unspecified spontaneous abortion without complication: Secondary | ICD-10-CM

## 2024-01-19 LAB — BETA HCG QUANT (REF LAB): hCG Quant: 4670 m[IU]/mL

## 2024-01-23 ENCOUNTER — Other Ambulatory Visit

## 2024-01-23 DIAGNOSIS — O039 Complete or unspecified spontaneous abortion without complication: Secondary | ICD-10-CM

## 2024-01-24 LAB — BETA HCG QUANT (REF LAB): hCG Quant: 3666 m[IU]/mL

## 2024-01-25 ENCOUNTER — Other Ambulatory Visit: Payer: Self-pay

## 2024-01-25 ENCOUNTER — Telehealth: Payer: Self-pay

## 2024-01-25 DIAGNOSIS — O039 Complete or unspecified spontaneous abortion without complication: Secondary | ICD-10-CM

## 2024-01-25 NOTE — Telephone Encounter (Signed)
 Pt called triage had questions about her Beta. Pt aware numbers are going down and we need them to be at 5-0. She is scheduled to have another lab done Monday at 9am.

## 2024-01-29 ENCOUNTER — Other Ambulatory Visit

## 2024-01-29 DIAGNOSIS — O039 Complete or unspecified spontaneous abortion without complication: Secondary | ICD-10-CM | POA: Diagnosis not present

## 2024-01-30 ENCOUNTER — Ambulatory Visit: Payer: Self-pay | Admitting: Certified Nurse Midwife

## 2024-01-30 LAB — BETA HCG QUANT (REF LAB): hCG Quant: 1975 m[IU]/mL

## 2024-01-31 ENCOUNTER — Other Ambulatory Visit

## 2024-01-31 DIAGNOSIS — O039 Complete or unspecified spontaneous abortion without complication: Secondary | ICD-10-CM | POA: Diagnosis not present

## 2024-02-01 LAB — BETA HCG QUANT (REF LAB): hCG Quant: 1425 m[IU]/mL

## 2024-02-05 ENCOUNTER — Other Ambulatory Visit: Payer: Self-pay | Admitting: Certified Nurse Midwife

## 2024-02-05 ENCOUNTER — Ambulatory Visit: Payer: Self-pay | Admitting: Certified Nurse Midwife

## 2024-02-05 DIAGNOSIS — O021 Missed abortion: Secondary | ICD-10-CM

## 2024-02-05 NOTE — Telephone Encounter (Signed)
 Patient inquiring about recent beta results. She has not read [name] [name]'s my chart message. Advised of [name] [name]'s message. Patient reports she is still bleeding. She states she has been bleeding fairly heavy since Friday. She reports she is not completely filling a pad. She is inquiring if additional testing is needed. Advised will discuss with [name] and return her call.

## 2024-02-06 ENCOUNTER — Ambulatory Visit (INDEPENDENT_AMBULATORY_CARE_PROVIDER_SITE_OTHER)

## 2024-02-06 DIAGNOSIS — O021 Missed abortion: Secondary | ICD-10-CM | POA: Diagnosis not present

## 2024-02-06 DIAGNOSIS — Z3A01 Less than 8 weeks gestation of pregnancy: Secondary | ICD-10-CM

## 2024-02-08 NOTE — Telephone Encounter (Addendum)
 Patient inquiring about 6/17 u/s results. Advised provider will be reviewing these results this afternoon. We will reach out to her once they are reviewed for next steps.

## 2024-02-09 ENCOUNTER — Other Ambulatory Visit

## 2024-02-09 ENCOUNTER — Telehealth: Payer: Self-pay

## 2024-02-14 ENCOUNTER — Ambulatory Visit (INDEPENDENT_AMBULATORY_CARE_PROVIDER_SITE_OTHER): Admitting: Obstetrics and Gynecology

## 2024-02-14 ENCOUNTER — Encounter: Payer: Self-pay | Admitting: Obstetrics and Gynecology

## 2024-02-14 VITALS — BP 134/85 | HR 106 | Ht 60.0 in | Wt 173.8 lb

## 2024-02-14 DIAGNOSIS — Z09 Encounter for follow-up examination after completed treatment for conditions other than malignant neoplasm: Secondary | ICD-10-CM

## 2024-02-14 DIAGNOSIS — O039 Complete or unspecified spontaneous abortion without complication: Secondary | ICD-10-CM

## 2024-02-14 DIAGNOSIS — Z01818 Encounter for other preprocedural examination: Secondary | ICD-10-CM

## 2024-02-14 DIAGNOSIS — O021 Missed abortion: Secondary | ICD-10-CM

## 2024-02-14 NOTE — Progress Notes (Signed)
 Patient presents today for a pre-op exam prior to [facility]. She states no additional concerns today.

## 2024-02-14 NOTE — Progress Notes (Signed)
 HPI:      [name] is a 28 y.o. G2P1001 who LMP was Patient's last menstrual period was 11/13/2023 (approximate).  Subjective:   She presents today for follow-up after SAB.  She reports that her bleeding has now stopped and she feels fine.  She has no problems at this time.  She does report that when her bleeding was heavy she thinks that she passed some tissue. In reviewing her ultrasound there is no gestational sac or evidence of pregnancy.  The endometrium is thin. Patient does not desire immediate fertility and does not desire birth control.  She reports that she is no longer sexually active at this time.    Hx: The following portions of the patient's history were reviewed and updated as appropriate:             She  has a past medical history of Allergy and Gestational diabetes. She does not have any pertinent problems on file. She  has a past surgical history that includes Mouth surgery. Her family history includes Cervical cancer in her maternal great-grandmother; Diabetes in an other family member; Hypertension in her maternal grandmother. She  reports that she has never smoked. She has never used smokeless tobacco. She reports that she does not currently use drugs. She reports that she does not drink alcohol. She has a current medication list which includes the following prescription(s): metronidazole . She is allergic to peanut-containing drug products.       Review of Systems:  Review of Systems  Constitutional: Denied constitutional symptoms, night sweats, recent illness, fatigue, fever, insomnia and weight loss.  Eyes: Denied eye symptoms, eye pain, photophobia, vision change and visual disturbance.  Ears/Nose/Throat/Neck: Denied ear, nose, throat or neck symptoms, hearing loss, nasal discharge, sinus congestion and sore throat.  Cardiovascular: Denied cardiovascular symptoms, arrhythmia, chest pain/pressure, edema, exercise intolerance, orthopnea and palpitations.   Respiratory: Denied pulmonary symptoms, asthma, pleuritic pain, productive sputum, cough, dyspnea and wheezing.  Gastrointestinal: Denied, gastro-esophageal reflux, melena, nausea and vomiting.  Genitourinary: Denied genitourinary symptoms including symptomatic vaginal discharge, pelvic relaxation issues, and urinary complaints.  Musculoskeletal: Denied musculoskeletal symptoms, stiffness, swelling, muscle weakness and myalgia.  Dermatologic: Denied dermatology symptoms, rash and scar.  Neurologic: Denied neurology symptoms, dizziness, headache, neck pain and syncope.  Psychiatric: Denied psychiatric symptoms, anxiety and depression.  Endocrine: Denied endocrine symptoms including hot flashes and night sweats.   Meds:   Current Outpatient Medications on File Prior to Visit  Medication Sig Dispense Refill   metroNIDAZOLE  (FLAGYL ) 500 MG tablet Take 1 tablet (500 mg total) by mouth 2 (two) times daily. 14 tablet 0   No current facility-administered medications on file prior to visit.      Objective:     Vitals:   02/14/24 1421  BP: 134/85  Pulse: (!) 106   Filed Weights   02/14/24 1421  Weight: 173 lb 12.8 oz (78.8 kg)                        Assessment:    G2P1001 Patient Active Problem List   Diagnosis Date Noted   Trichomonal vulvovaginitis 11/22/2021   Vaginal yeast infection 11/22/2021   Routine screening for STI (sexually transmitted infection) 10/29/2021   Class 1 obesity due to excess calories without serious comorbidity with body mass index (BMI) of 31.0 to 31.9 in adult 10/29/2021   History of gestational diabetes 10/29/2021   History of substance use 06/12/2018     1.  SAB (spontaneous abortion)     Based on signs and symptoms and patient's ultrasound likely incomplete AB.   Plan:            1.  I have offered her 2 options 1 expectant management and if she has irregular bleeding or other issues further workup would need to be performed.  2.  Second  option would be to follow quantitative beta hCGs until 0.  Patient does not want to do this and is most interested in knowing her own body and following for signs and symptoms.  Orders No orders of the defined types were placed in this encounter.   No orders of the defined types were placed in this encounter.     F/U  Return for Pt to contact us  if symptoms worsen.  Alm DOROTHA Sar, M.D. 02/14/2024 3:16 PM

## 2024-02-21 NOTE — Telephone Encounter (Signed)
 [name] [name]

## 2024-03-05 ENCOUNTER — Encounter: Payer: Self-pay | Admitting: Emergency Medicine

## 2024-03-05 ENCOUNTER — Ambulatory Visit
Admission: EM | Admit: 2024-03-05 | Discharge: 2024-03-05 | Disposition: A | Discharge status: Home / Self Care | Attending: Emergency Medicine | Admitting: Emergency Medicine

## 2024-03-05 DIAGNOSIS — Z113 Encounter for screening for infections with a predominantly sexual mode of transmission: Secondary | ICD-10-CM | POA: Insufficient documentation

## 2024-03-05 NOTE — Discharge Instructions (Addendum)
Labs pending 2-3 days, you will be contacted if positive for any sti and treatment will be sent to the pharmacy, you will have to return to the clinic if positive for gonorrhea to receive treatment   Please refrain from having sex until labs results, if positive please refrain from having sex until treatment complete and symptoms resolve   If positive for HIV, Syphilis, Chlamydia  gonorrhea or trichomoniasis please notify partner or partners so they may tested as well  Moving forward, it is recommended you use some form of protection against the transmission of sti infections  such as condoms or dental dams with each sexual encounter   

## 2024-03-05 NOTE — ED Triage Notes (Signed)
 Patient here requesting a complete STD check up. Denies any symptoms. Denies pain at this time.SABRA

## 2024-03-05 NOTE — ED Provider Notes (Signed)
 [facility]    CSN: [id number] Arrival date & time: 03/05/24  1040      History   Chief Complaint No chief complaint on file.   HPI [name] [name] is a 28 y.o. female.   Patient presents for routine STI testing.  Denies all symptoms.  Denies known exposure.  Past Medical History:  Diagnosis Date   Allergy    Gestational diabetes     Patient Active Problem List   Diagnosis Date Noted   Trichomonal vulvovaginitis 11/22/2021   Vaginal yeast infection 11/22/2021   Routine screening for STI (sexually transmitted infection) 10/29/2021   Class 1 obesity due to excess calories without serious comorbidity with body mass index (BMI) of 31.0 to 31.9 in adult 10/29/2021   History of gestational diabetes 10/29/2021   History of substance use 06/12/2018    Past Surgical History:  Procedure Laterality Date   MOUTH SURGERY      OB History     Gravida  2   Para  1   Term  1   Preterm      AB      Living  1      SAB      IAB      Ectopic      Multiple  0   Live Births  1            Home Medications    Prior to Admission medications   Medication Sig Start Date End Date Taking? Authorizing Provider  metroNIDAZOLE  (FLAGYL ) 500 MG tablet Take 1 tablet (500 mg total) by mouth 2 (two) times daily. 02/07/23   Lamptey, Aleene KIDD, MD    Family History Family History  Problem Relation Age of Onset   Hypertension Maternal Grandmother    Cervical cancer Maternal Great-grandmother    Diabetes Other        maternal great aunts and uncles    Social History Social History   Tobacco Use   Smoking status: Never   Smokeless tobacco: Never  Vaping Use   Vaping status: Never Used  Substance Use Topics   Alcohol use: Never   Drug use: Not Currently    Comment: UDS positive for MJ at [facility]     Allergies   Peanut-containing drug products   Review of Systems Review of Systems   Physical Exam Triage Vital Signs ED Triage Vitals  Encounter  Vitals Group     BP 03/05/24 1112 136/81     Girls Systolic BP Percentile --      Girls Diastolic BP Percentile --      Boys Systolic BP Percentile --      Boys Diastolic BP Percentile --      Pulse Rate 03/05/24 1112 85     Resp 03/05/24 1112 18     Temp 03/05/24 1112 98.4 F (36.9 C)     Temp src --      SpO2 03/05/24 1112 97 %     Weight --      Height --      Head Circumference --      Peak Flow --      Pain Score 03/05/24 1116 0     Pain Loc --      Pain Education --      Exclude from Growth Chart --    No data found.  Updated Vital Signs BP 136/81 (BP Location: Right Arm)   Pulse 85   Temp 98.4 F (36.9  C)   Resp 18   LMP  (LMP Unknown)   SpO2 97%   Breastfeeding No   Visual Acuity Right Eye Distance:   Left Eye Distance:   Bilateral Distance:    Right Eye Near:   Left Eye Near:    Bilateral Near:     Physical Exam Constitutional:      Appearance: Normal appearance.  Eyes:     Extraocular Movements: Extraocular movements intact.  Pulmonary:     Effort: Pulmonary effort is normal.  Genitourinary:    Comments: deferred Neurological:     Mental Status: She is alert and oriented to person, place, and time. Mental status is at baseline.      UC Treatments / Results  Labs (all labs ordered are listed, but only abnormal results are displayed) Labs Reviewed  RPR  HIV ANTIBODY (ROUTINE TESTING W REFLEX)  CERVICOVAGINAL ANCILLARY ONLY    EKG   Radiology No results found.  Procedures Procedures (including critical care time)  Medications Ordered in UC Medications - No data to display  Initial Impression / Assessment and Plan / UC Course  I have reviewed the triage vital signs and the nursing notes.  Pertinent labs & imaging results that were available during my care of the patient were reviewed by me and considered in my medical decision making (see chart for details).  Routine screening for sti   STI labs pending will treat per  protocol, advised abstinence until lab results, and/or treatment is complete, advised condom use during all sexual encounters moving, may follow-up with urgent care as needed  Final Clinical Impressions(s) / UC Diagnoses   Final diagnoses:  Routine screening for STI (sexually transmitted infection)     Discharge Instructions      Labs pending 2-3 days, you will be contacted if positive for any sti and treatment will be sent to the pharmacy, you will have to return to the clinic if positive for gonorrhea to receive treatment   Please refrain from having sex until labs results, if positive please refrain from having sex until treatment complete and symptoms resolve   If positive for HIV, Syphilis, Chlamydia  gonorrhea or trichomoniasis please notify partner or partners so they may tested as well  Moving forward, it is recommended you use some form of protection against the transmission of sti infections  such as condoms or dental dams with each sexual encounter     ED Prescriptions   None    PDMP not reviewed this encounter.   Teresa Shelba SAUNDERS, NP 03/05/24 1121

## 2024-03-06 LAB — CERVICOVAGINAL ANCILLARY ONLY
Bacterial Vaginitis (gardnerella): POSITIVE — AB
Candida Glabrata: NEGATIVE
Candida Vaginitis: NEGATIVE
Chlamydia: NEGATIVE
Comment: NEGATIVE
Comment: NEGATIVE
Comment: NEGATIVE
Comment: NEGATIVE
Comment: NEGATIVE
Comment: NORMAL
Neisseria Gonorrhea: NEGATIVE
Trichomonas: NEGATIVE

## 2024-03-06 LAB — HIV ANTIBODY (ROUTINE TESTING W REFLEX): HIV Screen 4th Generation wRfx: NONREACTIVE

## 2024-03-06 LAB — RPR: RPR Ser Ql: NONREACTIVE

## 2024-03-07 ENCOUNTER — Telehealth: Payer: Self-pay

## 2024-03-07 MED ORDER — METRONIDAZOLE 500 MG PO TABS: 500.0000 mg | ORAL_TABLET | Freq: Two times a day (BID) | ORAL | 0 refills | Status: AC

## 2024-03-07 NOTE — Telephone Encounter (Signed)
 Patient contacted Urgent Care regarding cytology results from previous visit 7/15.  Results provided. See prescriptions sent to patient pharmacy on file.

## 2024-03-08 ENCOUNTER — Ambulatory Visit (HOSPITAL_COMMUNITY): Payer: Self-pay

## 2024-09-11 ENCOUNTER — Emergency Department

## 2024-09-11 ENCOUNTER — Other Ambulatory Visit: Payer: Self-pay

## 2024-09-11 ENCOUNTER — Emergency Department
Admission: EM | Admit: 2024-09-11 | Discharge: 2024-09-11 | Disposition: A | Discharge status: Home / Self Care | Source: Home / Self Care

## 2024-09-11 ENCOUNTER — Encounter: Payer: Self-pay | Admitting: *Deleted

## 2024-09-11 DIAGNOSIS — O209 Hemorrhage in early pregnancy, unspecified: Secondary | ICD-10-CM | POA: Insufficient documentation

## 2024-09-11 DIAGNOSIS — Z5329 Procedure and treatment not carried out because of patient's decision for other reasons: Secondary | ICD-10-CM | POA: Diagnosis not present

## 2024-09-11 DIAGNOSIS — Z3A01 Less than 8 weeks gestation of pregnancy: Secondary | ICD-10-CM | POA: Insufficient documentation

## 2024-09-11 DIAGNOSIS — Z5321 Procedure and treatment not carried out due to patient leaving prior to being seen by health care provider: Secondary | ICD-10-CM

## 2024-09-11 DIAGNOSIS — O4690 Antepartum hemorrhage, unspecified, unspecified trimester: Secondary | ICD-10-CM

## 2024-09-11 LAB — CBC
HCT: 43 % (ref 36.0–46.0)
Hemoglobin: 13.9 g/dL (ref 12.0–15.0)
MCH: 27.7 pg (ref 26.0–34.0)
MCHC: 32.3 g/dL (ref 30.0–36.0)
MCV: 85.8 fL (ref 80.0–100.0)
Platelets: 359 K/uL (ref 150–400)
RBC: 5.01 MIL/uL (ref 3.87–5.11)
RDW: 12.9 % (ref 11.5–15.5)
WBC: 12.3 K/uL — ABNORMAL HIGH (ref 4.0–10.5)
nRBC: 0 % (ref 0.0–0.2)

## 2024-09-11 LAB — COMPREHENSIVE METABOLIC PANEL WITH GFR
ALT: 12 U/L (ref 0–44)
AST: 16 U/L (ref 15–41)
Albumin: 4.2 g/dL (ref 3.5–5.0)
Alkaline Phosphatase: 84 U/L (ref 38–126)
Anion gap: 11 (ref 5–15)
BUN: 7 mg/dL (ref 6–20)
CO2: 24 mmol/L (ref 22–32)
Calcium: 9.7 mg/dL (ref 8.9–10.3)
Chloride: 103 mmol/L (ref 98–111)
Creatinine, Ser: 0.76 mg/dL (ref 0.44–1.00)
GFR, Estimated: >60 mL/min
Glucose, Bld: 101 mg/dL — ABNORMAL HIGH (ref 70–99)
Potassium: 3.9 mmol/L (ref 3.5–5.1)
Sodium: 137 mmol/L (ref 135–145)
Total Bilirubin: 0.2 mg/dL (ref 0.0–1.2)
Total Protein: 7.2 g/dL (ref 6.5–8.1)

## 2024-09-11 LAB — URINALYSIS, ROUTINE W REFLEX MICROSCOPIC
Bilirubin Urine: NEGATIVE
Glucose, UA: NEGATIVE mg/dL
Hgb urine dipstick: NEGATIVE
Ketones, ur: NEGATIVE mg/dL
Leukocytes,Ua: NEGATIVE
Nitrite: NEGATIVE
Protein, ur: NEGATIVE mg/dL
Specific Gravity, Urine: 1.016 (ref 1.005–1.030)
pH: 8 (ref 5.0–8.0)

## 2024-09-11 LAB — HCG, QUANTITATIVE, PREGNANCY: hCG, Beta Chain, Quant, S: 27886 m[IU]/mL — ABNORMAL HIGH

## 2024-09-11 LAB — POC URINE PREG, ED: Preg Test, Ur: POSITIVE — AB

## 2024-09-11 NOTE — ED Provider Notes (Signed)
 "  [facility] Provider Note    Event Date/Time   First MD Initiated Contact with Patient 09/11/24 1855     (approximate)   History   Abdominal Pain and Vaginal Bleeding   HPI  [name] [name] is a 29 y.o. female approximately [redacted] weeks gestation by dates, presenting to the emergency department complaining of vaginal bleeding and lower abdominal pain.  Patient reports a history of a previous miscarriage and states that it felt the same.     Physical Exam   Triage Vital Signs: ED Triage Vitals  Encounter Vitals Group     BP 09/11/24 1822 105/71     Girls Systolic BP Percentile --      Girls Diastolic BP Percentile --      Boys Systolic BP Percentile --      Boys Diastolic BP Percentile --      Pulse Rate 09/11/24 1822 (!) 105     Resp 09/11/24 1822 20     Temp 09/11/24 1822 98.5 F (36.9 C)     Temp Source 09/11/24 1822 Oral     SpO2 09/11/24 1822 98 %     Weight 09/11/24 1820 170 lb (77.1 kg)     Height 09/11/24 1820 (!) 3 (0.076 m)     Head Circumference --      Peak Flow --      Pain Score 09/11/24 1820 5     Pain Loc --      Pain Education --      Exclude from Growth Chart --     Most recent vital signs: Vitals:   09/11/24 1822  BP: 105/71  Pulse: (!) 105  Resp: 20  Temp: 98.5 F (36.9 C)  SpO2: 98%     General: Awake, no distress.  CV:  Good peripheral perfusion.  Resp:  Normal effort.  Abd:  No distention.  Other:     ED Results / Procedures / Treatments   Labs (all labs ordered are listed, but only abnormal results are displayed) Labs Reviewed  COMPREHENSIVE METABOLIC PANEL WITH GFR - Abnormal; Notable for the following components:      Result Value   Glucose, Bld 101 (*)    All other components within normal limits  CBC - Abnormal; Notable for the following components:   WBC 12.3 (*)    All other components within normal limits  URINALYSIS, ROUTINE W REFLEX MICROSCOPIC - Abnormal; Notable for the following  components:   Color, Urine YELLOW (*)    APPearance CLOUDY (*)    All other components within normal limits  HCG, QUANTITATIVE, PREGNANCY - Abnormal; Notable for the following components:   hCG, Beta Chain, Quant, S 72,113 (*)    All other components within normal limits  POC URINE PREG, ED - Abnormal; Notable for the following components:   Preg Test, Ur POSITIVE (*)    All other components within normal limits     EKG     RADIOLOGY     PROCEDURES:  Critical Care performed: No  Procedures   MEDICATIONS ORDERED IN ED: Medications - No data to display   IMPRESSION / MDM / ASSESSMENT AND PLAN / ED COURSE  I reviewed the triage vital signs and the nursing notes.                              Differential diagnosis includes, but is not limited to, threatened miscarriage,  incomplete miscarriage, normal bleeding from an early trimester pregnancy, ectopic pregnancy, , blighted ovum, vaginal/cervical trauma, subchorionic hemorrhage/hematoma, etc.   Patient's presentation is most consistent with acute presentation with potential threat to life or bodily function.  Patient is a 29 year old female approximately [redacted] weeks gestation by dates presenting to the emergency department complaining of vaginal bleeding and lower abdominal cramping.  On workup the patient's pregnancy test is positive.  Ultrasound ordered for further evaluation.  When ultrasound went to retrieve the patient to perform imaging she was no longer in the room.  I we could not locate her in the emergency department.  It appears that she has eloped prior to completion of workup.      FINAL CLINICAL IMPRESSION(S) / ED DIAGNOSES   Final diagnoses:  Vaginal bleeding in pregnancy  Eloped from emergency department     Rx / DC Orders   ED Discharge Orders     None        Note:  This document was prepared using Dragon voice recognition software and may include unintentional dictation errors.    Rexford Reche HERO, MD 09/11/24 2340  "

## 2024-09-11 NOTE — ED Notes (Signed)
 Upon entering patient's room the patient was gone. This RN checked the unit bathroom. There were no patient belongings in the room.

## 2024-09-11 NOTE — ED Triage Notes (Signed)
 Pt ambulatory to triage  pt reports vaginal bleeding  pt is approx [redacted] weeks pregnant.  Pt has low abd cramping.  Pt alert.

## 2024-09-11 NOTE — ED Notes (Signed)
Poct pregnancy Positive
